# Patient Record
Sex: Male | Born: 1970 | Race: Black or African American | Hispanic: No | Marital: Single | Smoking: Current every day smoker
Health system: Southern US, Community
[De-identification: ages and names within clinical notes are randomized; demographics above are authoritative.]

## PROBLEM LIST (undated history)

## (undated) DIAGNOSIS — I10 Essential (primary) hypertension: Secondary | ICD-10-CM

## (undated) HISTORY — PX: APPENDECTOMY: SHX54

---

## 1998-02-12 ENCOUNTER — Emergency Department (HOSPITAL_COMMUNITY): Admission: EM | Admit: 1998-02-12 | Discharge: 1998-02-12 | Payer: Self-pay | Admitting: Emergency Medicine

## 2011-12-23 ENCOUNTER — Encounter (HOSPITAL_BASED_OUTPATIENT_CLINIC_OR_DEPARTMENT_OTHER): Payer: Self-pay | Admitting: *Deleted

## 2011-12-23 ENCOUNTER — Emergency Department (HOSPITAL_BASED_OUTPATIENT_CLINIC_OR_DEPARTMENT_OTHER)
Admission: EM | Admit: 2011-12-23 | Discharge: 2011-12-23 | Disposition: A | Discharge status: Home / Self Care | Payer: Self-pay | Attending: Emergency Medicine | Admitting: Emergency Medicine

## 2011-12-23 DIAGNOSIS — L02419 Cutaneous abscess of limb, unspecified: Secondary | ICD-10-CM | POA: Insufficient documentation

## 2011-12-23 DIAGNOSIS — L03119 Cellulitis of unspecified part of limb: Secondary | ICD-10-CM | POA: Insufficient documentation

## 2011-12-23 DIAGNOSIS — F17200 Nicotine dependence, unspecified, uncomplicated: Secondary | ICD-10-CM | POA: Insufficient documentation

## 2011-12-23 DIAGNOSIS — L03116 Cellulitis of left lower limb: Secondary | ICD-10-CM

## 2011-12-23 MED ORDER — CEPHALEXIN 500 MG PO CAPS: 500.0000 mg | ORAL_CAPSULE | Freq: Four times a day (QID) | ORAL | Status: DC

## 2011-12-23 MED ORDER — SULFAMETHOXAZOLE-TRIMETHOPRIM 800-160 MG PO TABS: 1.0000 | ORAL_TABLET | Freq: Two times a day (BID) | ORAL | Status: AC

## 2011-12-23 NOTE — ED Notes (Signed)
Abscess on left upper thigh for approximately one week.  Started as a pimple and has now increased in size.

## 2011-12-23 NOTE — ED Provider Notes (Signed)
History     CSN: [id number]  Arrival date & time 12/23/11  1044   First MD Initiated Contact with Patient 12/23/11 1130      Chief Complaint  Patient presents with  . Abscess    (Consider location/radiation/quality/duration/timing/severity/associated sxs/prior treatment) HPI Comments: Patient with a "pimple" on the left lower thigh several days ago.  He squeezed some pus from it, now it is becoming more red and painful.  No fevers or chills.  Patient is a 41 y.o. male presenting with abscess. The history is provided by the patient.  Abscess  This is a new problem. Episode onset: one week ago. The onset was gradual. The problem occurs continuously. The problem has been gradually worsening. Affected Location: left thigh. The abscess is characterized by painfulness and burning.    History reviewed. No pertinent past medical history.  History reviewed. No pertinent past surgical history.  No family history on file.  History  Substance Use Topics  . Smoking status: Current Every Day Smoker    Types: Cigarettes  . Smokeless tobacco: Not on file  . Alcohol Use: Yes     Comment: occassionally      Review of Systems  All other systems reviewed and are negative.    Allergies  Review of patient's allergies indicates no known allergies.  Home Medications  No current outpatient prescriptions on file.  BP 149/87  Pulse 101  Temp 98.2 F (36.8 C) (Oral)  Resp 18  SpO2 98%  Physical Exam  Nursing note and vitals reviewed. Constitutional: He is oriented to person, place, and time. He appears well-developed and well-nourished. No distress.  HENT:  Head: Normocephalic and atraumatic.  Neck: Normal range of motion. Neck supple.  Musculoskeletal: Normal range of motion.       The left thigh has a 3cm round erythematous lesion with a central area of excoriation.  No fluctuance.  Neurological: He is alert and oriented to person, place, and time.  Skin: Skin is warm and  dry. He is not diaphoretic.    ED Course  Procedures (including critical care time)  Labs Reviewed - No data to display No results found.   No diagnosis found.    MDM  I am unable to feel any fluctuance and it does not appear as though there is anything to drain.  He will be treated with antibiotics, warm soaks.  Return prn if worsens.        Geoffery Lyons, MD 12/23/11 1137

## 2013-09-22 ENCOUNTER — Emergency Department (HOSPITAL_BASED_OUTPATIENT_CLINIC_OR_DEPARTMENT_OTHER)
Admission: EM | Admit: 2013-09-22 | Discharge: 2013-09-22 | Disposition: A | Discharge status: Home / Self Care | Payer: Self-pay | Attending: Emergency Medicine | Admitting: Emergency Medicine

## 2013-09-22 ENCOUNTER — Encounter (HOSPITAL_BASED_OUTPATIENT_CLINIC_OR_DEPARTMENT_OTHER): Payer: Self-pay | Admitting: Emergency Medicine

## 2013-09-22 DIAGNOSIS — X58XXXA Exposure to other specified factors, initial encounter: Secondary | ICD-10-CM | POA: Insufficient documentation

## 2013-09-22 DIAGNOSIS — Y939 Activity, unspecified: Secondary | ICD-10-CM | POA: Insufficient documentation

## 2013-09-22 DIAGNOSIS — F17200 Nicotine dependence, unspecified, uncomplicated: Secondary | ICD-10-CM | POA: Insufficient documentation

## 2013-09-22 DIAGNOSIS — K089 Disorder of teeth and supporting structures, unspecified: Secondary | ICD-10-CM | POA: Insufficient documentation

## 2013-09-22 DIAGNOSIS — K0889 Other specified disorders of teeth and supporting structures: Secondary | ICD-10-CM

## 2013-09-22 DIAGNOSIS — S025XXA Fracture of tooth (traumatic), initial encounter for closed fracture: Secondary | ICD-10-CM | POA: Insufficient documentation

## 2013-09-22 DIAGNOSIS — Y929 Unspecified place or not applicable: Secondary | ICD-10-CM | POA: Insufficient documentation

## 2013-09-22 MED ORDER — IBUPROFEN 800 MG PO TABS: 800.0000 mg | ORAL_TABLET | Freq: Three times a day (TID) | ORAL | Status: DC

## 2013-09-22 MED ORDER — OXYCODONE-ACETAMINOPHEN 5-325 MG PO TABS
1.0000 | ORAL_TABLET | Freq: Once | ORAL | Status: AC
  Administered 2013-09-22: 1 via ORAL
  Filled 2013-09-22: qty 1

## 2013-09-22 MED ORDER — OXYCODONE-ACETAMINOPHEN 5-325 MG PO TABS: 1.0000 | ORAL_TABLET | ORAL | Status: DC | PRN

## 2013-09-22 MED ORDER — IBUPROFEN 800 MG PO TABS
800.0000 mg | ORAL_TABLET | Freq: Once | ORAL | Status: AC
  Administered 2013-09-22: 800 mg via ORAL
  Filled 2013-09-22: qty 1

## 2013-09-22 NOTE — ED Notes (Signed)
Patient here with left upper wisdom tooth that broke on Friday, pain from same

## 2013-09-22 NOTE — Discharge Instructions (Signed)

## 2013-09-22 NOTE — ED Provider Notes (Signed)
CSN: [id number]     Arrival date & time 09/22/13  1148 History   First MD Initiated Contact with Patient 09/22/13 1330     Chief Complaint  Patient presents with  . Dental Pain     (Consider location/radiation/quality/duration/timing/severity/associated sxs/prior Treatment) Patient is a 43 y.o. male presenting with tooth pain. The history is provided by the patient.  Dental Pain Location:  Upper Upper teeth location:  16/LU 3rd molar Severity:  Severe Onset quality:  Sudden Duration:  3 days Timing:  Constant Context: dental fracture   Relieved by:  Nothing Associated symptoms: no difficulty swallowing, no facial swelling, no fever and no neck pain     History reviewed. No pertinent past medical history. History reviewed. No pertinent past surgical history. No family history on file. History  Substance Use Topics  . Smoking status: Current Every Day Smoker    Types: Cigarettes  . Smokeless tobacco: Not on file  . Alcohol Use: Yes     Comment: occassionally    Review of Systems  Constitutional: Negative for fever.  HENT: Positive for dental problem. Negative for facial swelling and trouble swallowing.   Gastrointestinal: Negative for nausea.  Musculoskeletal: Negative for neck pain.  Skin: Negative for color change.      Allergies  Review of patient's allergies indicates no known allergies.  Home Medications   Prior to Admission medications   Not on File   BP 136/101  Pulse 86  Temp(Src) 98.3 F (36.8 C) (Oral)  Resp 18  Ht  (1.778 m)  Wt 225 lb (102.059 kg)  BMI 32.28 kg/m2  SpO2 97% Physical Exam  Constitutional: He appears well-developed and well-nourished. No distress.  Uncomfortable in appearance.   HENT:  Head: Normocephalic.  Mouth/Throat: Oropharynx is clear and moist.  Fracture to #16. No visualized abscess. No facial swelling.   Neck: Normal range of motion. Neck supple.  Pulmonary/Chest: Effort normal.  Lymphadenopathy:    He has  no cervical adenopathy.    ED Course  Procedures (including critical care time) Labs Review Labs Reviewed - No data to display  Imaging Review No results found.   EKG Interpretation None      MDM   Final diagnoses:  None    1. Dental fracture  Pain management provided. No abscess identified. Refer to dentistry.    Arnoldo Hooker, PA-C 09/22/13 1354

## 2013-09-24 NOTE — ED Provider Notes (Signed)
Medical screening examination/treatment/procedure(s) were performed by non-physician practitioner and as supervising physician I was immediately available for consultation/collaboration.   EKG Interpretation None        Belinda Schlichting T Teriann Livingood, MD 09/24/13 0709 

## 2014-09-29 ENCOUNTER — Encounter (HOSPITAL_COMMUNITY): Admission: EM | Disposition: A | Discharge status: Home / Self Care | Payer: Self-pay | Source: Home / Self Care

## 2014-09-29 ENCOUNTER — Inpatient Hospital Stay (HOSPITAL_COMMUNITY): Payer: Self-pay | Admitting: Anesthesiology

## 2014-09-29 ENCOUNTER — Emergency Department (HOSPITAL_COMMUNITY): Payer: Self-pay

## 2014-09-29 ENCOUNTER — Inpatient Hospital Stay (HOSPITAL_COMMUNITY)
Admission: EM | Admit: 2014-09-29 | Discharge: 2014-09-30 | DRG: 343 | Disposition: A | Discharge status: Home / Self Care | Payer: Self-pay | Attending: Surgery | Admitting: Surgery

## 2014-09-29 ENCOUNTER — Encounter (HOSPITAL_COMMUNITY): Payer: Self-pay | Admitting: *Deleted

## 2014-09-29 DIAGNOSIS — F17210 Nicotine dependence, cigarettes, uncomplicated: Secondary | ICD-10-CM | POA: Diagnosis present

## 2014-09-29 DIAGNOSIS — R1031 Right lower quadrant pain: Secondary | ICD-10-CM

## 2014-09-29 DIAGNOSIS — K3580 Unspecified acute appendicitis: Principal | ICD-10-CM | POA: Diagnosis present

## 2014-09-29 HISTORY — PX: LAPAROSCOPIC APPENDECTOMY: SHX408

## 2014-09-29 LAB — COMPREHENSIVE METABOLIC PANEL
ALK PHOS: 74 U/L (ref 38–126)
ALT: 26 U/L (ref 17–63)
ANION GAP: 12 (ref 5–15)
AST: 28 U/L (ref 15–41)
Albumin: 4.2 g/dL (ref 3.5–5.0)
BILIRUBIN TOTAL: 1.2 mg/dL (ref 0.3–1.2)
BUN: 9 mg/dL (ref 6–20)
CALCIUM: 9.4 mg/dL (ref 8.9–10.3)
CO2: 23 mmol/L (ref 22–32)
CREATININE: 1.12 mg/dL (ref 0.61–1.24)
Chloride: 100 mmol/L — ABNORMAL LOW (ref 101–111)
Glucose, Bld: 125 mg/dL — ABNORMAL HIGH (ref 65–99)
Potassium: 4 mmol/L (ref 3.5–5.1)
Sodium: 135 mmol/L (ref 135–145)
TOTAL PROTEIN: 7 g/dL (ref 6.5–8.1)

## 2014-09-29 LAB — DIFFERENTIAL
BASOS PCT: 0 % (ref 0–1)
Basophils Absolute: 0 10*3/uL (ref 0.0–0.1)
EOS PCT: 0 % (ref 0–5)
Eosinophils Absolute: 0 10*3/uL (ref 0.0–0.7)
Lymphocytes Relative: 12 % (ref 12–46)
Lymphs Abs: 2 10*3/uL (ref 0.7–4.0)
MONO ABS: 1.1 10*3/uL — AB (ref 0.1–1.0)
Monocytes Relative: 7 % (ref 3–12)
NEUTROS ABS: 13.6 10*3/uL — AB (ref 1.7–7.7)
NEUTROS PCT: 81 % — AB (ref 43–77)

## 2014-09-29 LAB — CBC
HCT: 43.4 % (ref 39.0–52.0)
HEMOGLOBIN: 14.9 g/dL (ref 13.0–17.0)
MCH: 29.6 pg (ref 26.0–34.0)
MCHC: 34.3 g/dL (ref 30.0–36.0)
MCV: 86.3 fL (ref 78.0–100.0)
PLATELETS: 280 10*3/uL (ref 150–400)
RBC: 5.03 MIL/uL (ref 4.22–5.81)
RDW: 13.2 % (ref 11.5–15.5)
WBC: 16.8 10*3/uL — AB (ref 4.0–10.5)

## 2014-09-29 LAB — LIPASE, BLOOD: Lipase: 18 U/L — ABNORMAL LOW (ref 22–51)

## 2014-09-29 LAB — URINALYSIS, ROUTINE W REFLEX MICROSCOPIC
Bilirubin Urine: NEGATIVE
GLUCOSE, UA: NEGATIVE mg/dL
Hgb urine dipstick: NEGATIVE
KETONES UR: NEGATIVE mg/dL
LEUKOCYTES UA: NEGATIVE
NITRITE: NEGATIVE
PROTEIN: NEGATIVE mg/dL
Specific Gravity, Urine: 1.009 (ref 1.005–1.030)
UROBILINOGEN UA: 0.2 mg/dL (ref 0.0–1.0)
pH: 6.5 (ref 5.0–8.0)

## 2014-09-29 SURGERY — APPENDECTOMY, LAPAROSCOPIC
Anesthesia: General

## 2014-09-29 MED ORDER — CIPROFLOXACIN IN D5W 400 MG/200ML IV SOLN
INTRAVENOUS | Status: DC | PRN
  Administered 2014-09-29: 400 mg via INTRAVENOUS

## 2014-09-29 MED ORDER — ONDANSETRON 4 MG PO TBDP
4.0000 mg | ORAL_TABLET | Freq: Four times a day (QID) | ORAL | Status: DC | PRN
Start: 2014-09-29 — End: 2014-09-30

## 2014-09-29 MED ORDER — FENTANYL CITRATE (PF) 100 MCG/2ML IJ SOLN
INTRAMUSCULAR | Status: AC
  Administered 2014-09-29: 50 ug via NASAL
  Filled 2014-09-29: qty 2

## 2014-09-29 MED ORDER — LIDOCAINE HCL (CARDIAC) 20 MG/ML IV SOLN
INTRAVENOUS | Status: DC | PRN
  Administered 2014-09-29: 80 mg via INTRAVENOUS

## 2014-09-29 MED ORDER — PROMETHAZINE HCL 25 MG/ML IJ SOLN: 6.2500 mg | INTRAMUSCULAR | Status: DC | PRN

## 2014-09-29 MED ORDER — BUPIVACAINE-EPINEPHRINE 0.25% -1:200000 IJ SOLN
INTRAMUSCULAR | Status: DC | PRN
  Administered 2014-09-29 (×2): 10 mL

## 2014-09-29 MED ORDER — HYDROMORPHONE HCL 1 MG/ML IJ SOLN
INTRAMUSCULAR | Status: AC
  Filled 2014-09-29: qty 1

## 2014-09-29 MED ORDER — SODIUM CHLORIDE 0.9 % IV SOLN
INTRAVENOUS | Status: DC | PRN
  Administered 2014-09-29: 21:00:00 via INTRAVENOUS

## 2014-09-29 MED ORDER — ARTIFICIAL TEARS OP OINT
TOPICAL_OINTMENT | OPHTHALMIC | Status: DC | PRN
  Administered 2014-09-29: 1 via OPHTHALMIC

## 2014-09-29 MED ORDER — OXYCODONE-ACETAMINOPHEN 5-325 MG PO TABS
1.0000 | ORAL_TABLET | ORAL | Status: DC | PRN
  Administered 2014-09-30: 2 via ORAL
  Administered 2014-09-30: 1 via ORAL
  Filled 2014-09-29: qty 2
  Filled 2014-09-29: qty 1

## 2014-09-29 MED ORDER — CIPROFLOXACIN IN D5W 400 MG/200ML IV SOLN
400.0000 mg | Freq: Once | INTRAVENOUS | Status: AC
  Administered 2014-09-29: 400 mg via INTRAVENOUS
  Filled 2014-09-29: qty 200

## 2014-09-29 MED ORDER — SUCCINYLCHOLINE CHLORIDE 20 MG/ML IJ SOLN
INTRAMUSCULAR | Status: DC | PRN
  Administered 2014-09-29: 140 mg via INTRAVENOUS

## 2014-09-29 MED ORDER — FENTANYL CITRATE (PF) 250 MCG/5ML IJ SOLN
INTRAMUSCULAR | Status: AC
  Filled 2014-09-29: qty 5

## 2014-09-29 MED ORDER — FENTANYL CITRATE (PF) 100 MCG/2ML IJ SOLN
INTRAMUSCULAR | Status: DC | PRN
  Administered 2014-09-29: 50 ug via INTRAVENOUS
  Administered 2014-09-29 (×2): 100 ug via INTRAVENOUS

## 2014-09-29 MED ORDER — MORPHINE SULFATE (PF) 4 MG/ML IV SOLN
4.0000 mg | Freq: Once | INTRAVENOUS | Status: AC
Start: 2014-09-29 — End: 2014-09-29
  Administered 2014-09-29: 4 mg via INTRAVENOUS
  Filled 2014-09-29: qty 1

## 2014-09-29 MED ORDER — METRONIDAZOLE IN NACL 5-0.79 MG/ML-% IV SOLN
500.0000 mg | Freq: Three times a day (TID) | INTRAVENOUS | Status: AC
Start: 2014-09-30 — End: 2014-09-30
  Administered 2014-09-30 (×2): 500 mg via INTRAVENOUS
  Filled 2014-09-29 (×2): qty 100

## 2014-09-29 MED ORDER — MORPHINE SULFATE (PF) 2 MG/ML IV SOLN: 2.0000 mg | INTRAVENOUS | Status: DC | PRN

## 2014-09-29 MED ORDER — IOHEXOL 300 MG/ML  SOLN
25.0000 mL | Freq: Once | INTRAMUSCULAR | Status: AC | PRN
  Administered 2014-09-29: 25 mL via ORAL

## 2014-09-29 MED ORDER — DEXTROSE 5 % IV SOLN
2.0000 g | INTRAVENOUS | Status: AC
  Administered 2014-09-30: 2 g via INTRAVENOUS
  Filled 2014-09-29: qty 2

## 2014-09-29 MED ORDER — KCL IN DEXTROSE-NACL 20-5-0.45 MEQ/L-%-% IV SOLN
INTRAVENOUS | Status: AC
  Filled 2014-09-29: qty 1000

## 2014-09-29 MED ORDER — DEXAMETHASONE SODIUM PHOSPHATE 10 MG/ML IJ SOLN
INTRAMUSCULAR | Status: AC
  Filled 2014-09-29: qty 1

## 2014-09-29 MED ORDER — SODIUM CHLORIDE 0.9 % IR SOLN
Status: DC | PRN
  Administered 2014-09-29: 1

## 2014-09-29 MED ORDER — MIDAZOLAM HCL 5 MG/5ML IJ SOLN
INTRAMUSCULAR | Status: DC | PRN
  Administered 2014-09-29: 2 mg via INTRAVENOUS

## 2014-09-29 MED ORDER — MIDAZOLAM HCL 2 MG/2ML IJ SOLN
INTRAMUSCULAR | Status: AC
  Filled 2014-09-29: qty 4

## 2014-09-29 MED ORDER — ONDANSETRON HCL 4 MG/2ML IJ SOLN: 4.0000 mg | Freq: Four times a day (QID) | INTRAMUSCULAR | Status: DC | PRN

## 2014-09-29 MED ORDER — METRONIDAZOLE IN NACL 5-0.79 MG/ML-% IV SOLN
INTRAVENOUS | Status: DC | PRN
  Administered 2014-09-29: 500 mg via INTRAVENOUS

## 2014-09-29 MED ORDER — HYDROMORPHONE HCL 1 MG/ML IJ SOLN
INTRAMUSCULAR | Status: AC
  Administered 2014-09-29: 0.5 mg via INTRAVENOUS
  Filled 2014-09-29: qty 1

## 2014-09-29 MED ORDER — IOHEXOL 300 MG/ML  SOLN
80.0000 mL | Freq: Once | INTRAMUSCULAR | Status: AC | PRN
  Administered 2014-09-29: 80 mL via INTRAVENOUS

## 2014-09-29 MED ORDER — KCL IN DEXTROSE-NACL 20-5-0.45 MEQ/L-%-% IV SOLN
INTRAVENOUS | Status: DC
  Administered 2014-09-29: 100 mL/h via INTRAVENOUS

## 2014-09-29 MED ORDER — BUPIVACAINE-EPINEPHRINE (PF) 0.25% -1:200000 IJ SOLN
INTRAMUSCULAR | Status: AC
Start: 2014-09-29 — End: 2014-09-29
  Filled 2014-09-29: qty 30

## 2014-09-29 MED ORDER — HYDROMORPHONE HCL 1 MG/ML IJ SOLN
0.2500 mg | INTRAMUSCULAR | Status: DC | PRN
  Administered 2014-09-29 (×3): 0.5 mg via INTRAVENOUS

## 2014-09-29 MED ORDER — SODIUM CHLORIDE 0.9 % IV BOLUS (SEPSIS)
1000.0000 mL | Freq: Once | INTRAVENOUS | Status: AC
  Administered 2014-09-29: 1000 mL via INTRAVENOUS

## 2014-09-29 MED ORDER — MORPHINE SULFATE (PF) 4 MG/ML IV SOLN
4.0000 mg | Freq: Once | INTRAVENOUS | Status: AC
  Administered 2014-09-29: 4 mg via INTRAVENOUS
  Filled 2014-09-29: qty 1

## 2014-09-29 MED ORDER — SUGAMMADEX SODIUM 200 MG/2ML IV SOLN
INTRAVENOUS | Status: DC | PRN
  Administered 2014-09-29: 200 mg via INTRAVENOUS

## 2014-09-29 MED ORDER — ROCURONIUM BROMIDE 100 MG/10ML IV SOLN
INTRAVENOUS | Status: DC | PRN
  Administered 2014-09-29: 20 mg via INTRAVENOUS
  Administered 2014-09-29: 30 mg via INTRAVENOUS

## 2014-09-29 MED ORDER — METRONIDAZOLE IN NACL 5-0.79 MG/ML-% IV SOLN
500.0000 mg | Freq: Once | INTRAVENOUS | Status: AC
  Administered 2014-09-29: 500 mg via INTRAVENOUS
  Filled 2014-09-29: qty 100

## 2014-09-29 MED ORDER — CEFAZOLIN SODIUM-DEXTROSE 2-3 GM-% IV SOLR
INTRAVENOUS | Status: AC
Start: 2014-09-29 — End: 2014-09-29
  Filled 2014-09-29: qty 50

## 2014-09-29 MED ORDER — HEPARIN SODIUM (PORCINE) 5000 UNIT/ML IJ SOLN
5000.0000 [IU] | Freq: Three times a day (TID) | INTRAMUSCULAR | Status: DC
  Administered 2014-09-30: 5000 [IU] via SUBCUTANEOUS
  Filled 2014-09-29: qty 1

## 2014-09-29 MED ORDER — LACTATED RINGERS IV SOLN
INTRAVENOUS | Status: DC | PRN
  Administered 2014-09-29 (×2): via INTRAVENOUS

## 2014-09-29 MED ORDER — ONDANSETRON HCL 4 MG/2ML IJ SOLN
INTRAMUSCULAR | Status: DC | PRN
  Administered 2014-09-29: 4 mg via INTRAVENOUS

## 2014-09-29 MED ORDER — FENTANYL CITRATE (PF) 100 MCG/2ML IJ SOLN
50.0000 ug | Freq: Once | INTRAMUSCULAR | Status: AC
  Administered 2014-09-29: 50 ug via NASAL

## 2014-09-29 MED ORDER — PROPOFOL 10 MG/ML IV BOLUS
INTRAVENOUS | Status: DC | PRN
  Administered 2014-09-29: 280 mg via INTRAVENOUS

## 2014-09-29 SURGICAL SUPPLY — 36 items
APPLIER CLIP ROT 10 11.4 M/L (STAPLE)
CANISTER SUCTION 2500CC (MISCELLANEOUS) ×2 IMPLANT
CHLORAPREP W/TINT 26ML (MISCELLANEOUS) ×2 IMPLANT
CLIP APPLIE ROT 10 11.4 M/L (STAPLE) IMPLANT
COVER SURGICAL LIGHT HANDLE (MISCELLANEOUS) ×2 IMPLANT
CUTTER FLEX LINEAR 45M (STAPLE) ×2 IMPLANT
ELECT REM PT RETURN 9FT ADLT (ELECTROSURGICAL) ×2
ELECTRODE REM PT RTRN 9FT ADLT (ELECTROSURGICAL) ×1 IMPLANT
GLOVE BIO SURGEON STRL SZ7 (GLOVE) ×2 IMPLANT
GLOVE BIOGEL PI IND STRL 7.0 (GLOVE) ×1 IMPLANT
GLOVE BIOGEL PI INDICATOR 7.0 (GLOVE) ×1
GOWN STRL REUS W/ TWL LRG LVL3 (GOWN DISPOSABLE) ×3 IMPLANT
GOWN STRL REUS W/TWL LRG LVL3 (GOWN DISPOSABLE) ×3
KIT BASIN OR (CUSTOM PROCEDURE TRAY) ×2 IMPLANT
KIT ROOM TURNOVER OR (KITS) ×2 IMPLANT
LIQUID BAND (GAUZE/BANDAGES/DRESSINGS) ×2 IMPLANT
NS IRRIG 1000ML POUR BTL (IV SOLUTION) ×2 IMPLANT
PAD ARMBOARD 7.5X6 YLW CONV (MISCELLANEOUS) ×4 IMPLANT
POUCH RETRIEVAL ECOSAC 10 (ENDOMECHANICALS) ×1 IMPLANT
POUCH RETRIEVAL ECOSAC 10MM (ENDOMECHANICALS) ×1
RELOAD 45 VASCULAR/THIN (ENDOMECHANICALS) IMPLANT
RELOAD STAPLE TA45 3.5 REG BLU (ENDOMECHANICALS) ×2 IMPLANT
SCALPEL HARMONIC ACE (MISCELLANEOUS) IMPLANT
SCISSORS LAP 5X35 DISP (ENDOMECHANICALS) ×2 IMPLANT
SET IRRIG TUBING LAPAROSCOPIC (IRRIGATION / IRRIGATOR) ×2 IMPLANT
SLEEVE ENDOPATH XCEL 5M (ENDOMECHANICALS) ×2 IMPLANT
SPECIMEN JAR SMALL (MISCELLANEOUS) ×2 IMPLANT
STRIP CLOSURE SKIN 1/2X4 (GAUZE/BANDAGES/DRESSINGS) IMPLANT
SUT MNCRL AB 4-0 PS2 18 (SUTURE) ×2 IMPLANT
TOWEL OR 17X24 6PK STRL BLUE (TOWEL DISPOSABLE) ×2 IMPLANT
TOWEL OR 17X26 10 PK STRL BLUE (TOWEL DISPOSABLE) ×2 IMPLANT
TRAY FOLEY CATH 16FR SILVER (SET/KITS/TRAYS/PACK) IMPLANT
TRAY LAPAROSCOPIC MC (CUSTOM PROCEDURE TRAY) ×2 IMPLANT
TROCAR XCEL BLUNT TIP 100MML (ENDOMECHANICALS) ×2 IMPLANT
TROCAR XCEL NON-BLD 5MMX100MML (ENDOMECHANICALS) ×2 IMPLANT
TUBING INSUFFLATION (TUBING) ×2 IMPLANT

## 2014-09-29 NOTE — Anesthesia Preprocedure Evaluation (Addendum)
Anesthesia Evaluation  Patient identified by MRN, date of birth, ID band Patient awake    Reviewed: Allergy & Precautions, NPO status , Patient's Chart, lab work & pertinent test results  Airway Mallampati: II  TM Distance: >3 FB Neck ROM: Full    Dental   Pulmonary Current Smoker,  breath sounds clear to auscultation        Cardiovascular negative cardio ROS  Rhythm:Regular Rate:Normal     Neuro/Psych    GI/Hepatic negative GI ROS, Neg liver ROS,   Endo/Other  negative endocrine ROS  Renal/GU negative Renal ROS     Musculoskeletal   Abdominal   Peds  Hematology   Anesthesia Other Findings   Reproductive/Obstetrics                            Anesthesia Physical Anesthesia Plan  ASA: II and emergent  Anesthesia Plan: General   Post-op Pain Management:    Induction: Intravenous, Rapid sequence and Cricoid pressure planned  Airway Management Planned:   Additional Equipment:   Intra-op Plan:   Post-operative Plan: Extubation in OR  Informed Consent: I have reviewed the patients History and Physical, chart, labs and discussed the procedure including the risks, benefits and alternatives for the proposed anesthesia with the patient or authorized representative who has indicated his/her understanding and acceptance.   Dental advisory given  Plan Discussed with: CRNA, Anesthesiologist and Surgeon  Anesthesia Plan Comments:         Anesthesia Quick Evaluation

## 2014-09-29 NOTE — ED Notes (Signed)
Pt's shirt, undershirt, pants, sandals, and socks all placed in belongings bag with pt's sticker on bag.  Cell phone in pants pocket.  Pt sts he sent his wallet home with his girlfriend.

## 2014-09-29 NOTE — ED Notes (Signed)
Pt back from CT.  Able to ambulate to bathroom independently.  Gait steady and even, but pt hunched over due to pain.

## 2014-09-29 NOTE — ED Notes (Signed)
Surgeon at bedside.  EDP at bedside

## 2014-09-29 NOTE — ED Provider Notes (Signed)
CSN: [id number]     Arrival date & time 09/29/14  1338 History   First MD Initiated Contact with Patient 09/29/14 1715     Chief Complaint  Patient presents with  . Abdominal Pain     (Consider location/radiation/quality/duration/timing/severity/associated sxs/prior Treatment) HPI Comments: Jay Sweeney is a 44 y.o. male who presents to the ED with complaints of right lower quadrant abdominal pain that began suddenly yesterday around 5 PM. He distress. 10/10 constant sharp nonradiating pain worse with movement, and unrelieved with Gas-X at home and sentinel here. Associated symptoms include chills and one episode of watery diarrhea today. He states that initially he was constipated, but he passed several small hard stools just prior to the diarrhea episode. States he was nauseated yesterday but no longer nauseated today. He denies any fevers, chest pain, shortness of breath, nausea, vomiting, ongoing constipation, obstipation, rectal pain, melena, hematochezia, testicular pain or swelling, penile discharge, dysuria, hematuria, numbness, tingling, weakness, recent travel, sick contacts, antibiotic use, suspicious food intake, chronic NSAID use, or prior abdominal surgeries. He admits to drinking "a few beers" daily.  Last meal was dinner last night.  Patient is a 44 y.o. male presenting with abdominal pain. The history is provided by the patient. No language interpreter was used.  Abdominal Pain Pain location:  RLQ Pain quality: sharp   Pain radiates to:  Does not radiate Pain severity:  Severe Onset quality:  Sudden Duration:  24 hours Timing:  Constant Progression:  Worsening Chronicity:  New Context: not recent travel, not sick contacts and not suspicious food intake   Relieved by:  Nothing Worsened by:  Movement Ineffective treatments: fentanyl , gas x. Associated symptoms: chills and diarrhea (x1)   Associated symptoms: no chest pain, no constipation, no dysuria, no fever,  no flatus, no hematemesis, no hematochezia, no hematuria, no melena, no nausea (none today), no shortness of breath and no vomiting   Risk factors: alcohol abuse   Risk factors: has not had multiple surgeries and no NSAID use     History reviewed. No pertinent past medical history. History reviewed. No pertinent past surgical history. History reviewed. No pertinent family history. Social History  Substance Use Topics  . Smoking status: Current Every Day Smoker    Types: Cigarettes  . Smokeless tobacco: None  . Alcohol Use: Yes     Comment: occassionally    Review of Systems  Constitutional: Positive for chills. Negative for fever.  Respiratory: Negative for shortness of breath.   Cardiovascular: Negative for chest pain.  Gastrointestinal: Positive for abdominal pain and diarrhea (x1). Negative for nausea (none today), vomiting, constipation, blood in stool, melena, hematochezia, flatus and hematemesis.  Genitourinary: Negative for dysuria, hematuria, flank pain, discharge, scrotal swelling and testicular pain.  Musculoskeletal: Negative for myalgias and arthralgias.  Skin: Negative for color change.  Allergic/Immunologic: Negative for immunocompromised state.  Neurological: Negative for weakness and numbness.  Psychiatric/Behavioral: Negative for confusion.   10 Systems reviewed and are negative for acute change except as noted in the HPI.    Allergies  Review of patient's allergies indicates no known allergies.  Home Medications   Prior to Admission medications   Medication Sig Start Date End Date Taking? Authorizing Provider  ibuprofen (ADVIL,MOTRIN) 800 MG tablet Take 1 tablet (800 mg total) by mouth 3 (three) times daily. 09/22/13  Yes Elpidio Anis, PA-C  oxyCODONE-acetaminophen (PERCOCET/ROXICET) 5-325 MG per tablet Take 1-2 tablets by mouth every 4 (four) hours as needed for severe pain. 09/22/13  Yes Shari Upstill, PA-C   BP 150/100 mmHg  Pulse 88  Temp(Src) 99 F  (37.2 C) (Oral)  Resp 17  SpO2 95% Physical Exam  Constitutional: He is oriented to person, place, and time. Vital signs are normal. He appears well-developed and well-nourished.  Non-toxic appearance. He appears distressed.  Afebrile, nontoxic, appears uncomfortable curled into fetal position  HENT:  Head: Normocephalic and atraumatic.  Mouth/Throat: Oropharynx is clear and moist and mucous membranes are normal.  Eyes: Conjunctivae and EOM are normal. Right eye exhibits no discharge. Left eye exhibits no discharge.  Neck: Normal range of motion. Neck supple.  Cardiovascular: Normal rate, regular rhythm, normal heart sounds and intact distal pulses.  Exam reveals no gallop and no friction rub.   No murmur heard. Pulmonary/Chest: Effort normal and breath sounds normal. No respiratory distress. He has no decreased breath sounds. He has no wheezes. He has no rhonchi. He has no rales.  Abdominal: Soft. Normal appearance and bowel sounds are normal. He exhibits no distension. There is tenderness. There is guarding and tenderness at McBurney's point. There is no rigidity, no rebound, no CVA tenderness and negative Murphy's sign.    Soft, nondistended, +BS throughout, with RLQ TTP at mcburney's point, mild voluntary guarding, no rigidity/rebound, neg psoas sign, neg foot tap test, neg murphy's, no CVA TTP   Musculoskeletal: Normal range of motion.  Neurological: He is alert and oriented to person, place, and time. He has normal strength. No sensory deficit.  Skin: Skin is warm, dry and intact. No rash noted.  Psychiatric: He has a normal mood and affect.  Nursing note and vitals reviewed.   ED Course  Procedures (including critical care time) Labs Review Labs Reviewed  LIPASE, BLOOD - Abnormal; Notable for the following:    Lipase 18 (*)    All other components within normal limits  COMPREHENSIVE METABOLIC PANEL - Abnormal; Notable for the following:    Chloride 100 (*)    Glucose, Bld  125 (*)    All other components within normal limits  CBC - Abnormal; Notable for the following:    WBC 16.8 (*)    All other components within normal limits  DIFFERENTIAL - Abnormal; Notable for the following:    Neutrophils Relative % 81 (*)    Neutro Abs 13.6 (*)    Monocytes Absolute 1.1 (*)    All other components within normal limits  URINALYSIS, ROUTINE W REFLEX MICROSCOPIC (NOT AT [facility])    Imaging Review Ct Abdomen Pelvis W Contrast  09/29/2014   CLINICAL DATA:  Right lower quadrant pain starting 1 day ago.  EXAM: CT ABDOMEN AND PELVIS WITH CONTRAST  TECHNIQUE: Multidetector CT imaging of the abdomen and pelvis was performed using the standard protocol following bolus administration of intravenous contrast.  CONTRAST:  80mL OMNIPAQUE IOHEXOL 300 MG/ML  SOLN  COMPARISON:  None.  FINDINGS: Lower chest:  Unremarkable.  Hepatobiliary: No masses or other significant abnormality identified.  Pancreas: No evidence of mass, inflammatory changes, or other significant abnormality.  Spleen:  Within normal limits in size and appearance.  Adrenal Glands:  No masses identified.  Kidneys/Urinary Tract: No evidence of urolithiasis or hydronephrosis. No solid or complex cystic renal masses identified. No masses or calculi seen involving the lower urinary tract.  Stomach/Bowel/Peritoneum: No evidence of wall thickening, mass, or obstruction in the upper gastrointestinal tract.  Vascular/Lymphatic: No pathologically enlarged lymph nodes identified. No other significant abnormality noted.  Reproductive: No masses or other significant abnormality identified.  Other: The appendix is thickened to 1.1 cm and contains an appendicolith proximally. Surrounding inflammatory fat stranding is seen. There is no evidence of rupture.  Musculoskeletal:  No suspicious bone lesions identified.  IMPRESSION: Acute appendicitis.  Surgical consult is recommended.  Otherwise normal appearance of the abdomen and pelvis.  These results  will be called to the ordering clinician or representative by the Radiologist Assistant, and communication documented in the PACS or zVision Dashboard.   Electronically Signed   By: Ted Mcalpine M.D.   On: 09/29/2014 19:17   I have personally reviewed and evaluated these images and lab results as part of my medical decision-making.   EKG Interpretation None      MDM   Final diagnoses:  Acute appendicitis, unspecified acute appendicitis type  RLQ abdominal pain    44 y.o. male here with sudden onset RLQ abd pain x1 day, some diarrhea once today. No tenesmus or rectal pain, no testicular or urinary symptoms. On exam, RLQ TTP at mcburney's point with some guarding, no rebound or rigidity. CBC with leukocytosis of 16.8, will obtain differential. CMP WNL aside from mildly elevated gluc. Lipase WNL. Will get U/A and CT abd/pelvis to eval for appendicitis vs diverticulitis vs other etiology.   7:25 PM Differential with neutrophilic predom. U/A pending. CT abd/pelvis critcal value called in just now, pt with acute appendicitis. Will consult surgery. Will start cipro/flagyl  7:30 PM U/A clear. Dr. Sheliah Hatch returning page, will come see pt for acute appy. Please see his notes for further documentation of care.  BP 159/90 mmHg  Pulse 81  Temp(Src) 99 F (37.2 C) (Oral)  Resp 16  SpO2 97%  Meds ordered this encounter  Medications  . fentaNYL (SUBLIMAZE) injection 50 mcg    Sig:   . fentaNYL (SUBLIMAZE) 100 MCG/2ML injection    Sig:     Browning, Melissa   : cabinet override  . sodium chloride 0.9 % bolus 1,000 mL    Sig:   . morphine 4 MG/ML injection 4 mg    Sig:   . iohexol (OMNIPAQUE) 300 MG/ML solution 25 mL    Sig:   . iohexol (OMNIPAQUE) 300 MG/ML solution 80 mL    Sig:   . morphine 4 MG/ML injection 4 mg    Sig:   . ciprofloxacin (CIPRO) IVPB 400 mg    Sig:     Order Specific Question:  Antibiotic Indication:    Answer:  Intra-abdominal Infection  .  metroNIDAZOLE (FLAGYL) IVPB 500 mg    Sig:     Order Specific Question:  Antibiotic Indication:    Answer:  Intra-abdominal Infection     Allen Derry, PA-C 09/29/14 1931  Laurence Spates, MD 09/30/14 [phone]

## 2014-09-29 NOTE — Transfer of Care (Signed)
Immediate Anesthesia Transfer of Care Note  Patient: Jay Sweeney  Procedure(s) Performed: Procedure(s): APPENDECTOMY LAPAROSCOPIC (N/A)  Patient Location: PACU  Anesthesia Type:General  Level of Consciousness: awake, oriented, sedated, patient cooperative and responds to stimulation  Airway & Oxygen Therapy: Patient Spontanous Breathing and Patient connected to face mask oxygen  Post-op Assessment: Report given to RN, Post -op Vital signs reviewed and stable, Patient moving all extremities and Patient moving all extremities X 4  Post vital signs: Reviewed and stable  Last Vitals:  Filed Vitals:   09/29/14 2000  BP: 146/92  Pulse: 84  Temp:   Resp:     Complications: No apparent anesthesia complications

## 2014-09-29 NOTE — ED Notes (Signed)
This RN has patient sign consent.  Care handoff given to OR RN.

## 2014-09-29 NOTE — ED Notes (Signed)
[facility] radiology informed this RN that the pt is diagnosed with acute appendicitis.  PA made aware.

## 2014-09-29 NOTE — ED Notes (Signed)
Left nare administration of of Fentanyl

## 2014-09-29 NOTE — Op Note (Signed)
Preoperative diagnosis: acute suppurative appendicitis  Postoperative diagnosis: Same   Procedure: laparoscopic appendectomy  Surgeon: Feliciana Rossetti, M.D.  Anesthesia: Gen.   Indications for procedure: Jay Sweeney is a 44 y.o. male with symptoms of diarrhea, pain in right lower quadrant and nausea consistent with acute appendicitis. Confirmed by CT scan and laboratory values.  Description of procedure: The patient was brought into the operative suite, placed supine. Anesthesia was administered with endotracheal tube. The patient's left arm was tucked. All pressure points were offloaded by foam padding. The patient was prepped and draped in the usual sterile fashion.  Vertial incision was made through the umbilicus and a blunt 10mm trocar was placed into the fascial defect. Pneumoperitoneum was applied with high flow low pressure.  2 5mm trocars were placed, one in the suprapubic space, one in the LLQ. All trocars sites were first anesthesized with 0.25% marcaine with epinephrine. Next the patient was placed in trendelenberg, rotated to the left. The omentumwas retracted cephalad. The cecum and appendix were identified. The appendix was inflamed and friable throughout. The base of the appendix was dissected and a window through the mesoappendix was created with blunt dissection. A 45mm blue load stapler was used to go across the base of the appendix which was healthy tissue. Next an endoloop was used to ligate the mesoappendix and then the appendix and mesoappendix were separated with cautery using a hook. Hemostasis was ensured.  The appendix was placed in a specimen bag. The pelvis and RLQ were irrigated. No abscess or purulence were seen in the pelvis. The appendix was removed via the umbilicus. 0 vicryl was used to close the fascial defect. Pneumoperitoneum was removed, all trocars were removed. All incisions were closed with 4-0 monocryl subcuticular stitch. The patient woke from anesthesia  and was brought to PACU in stable condition.  Findings: suppurative appendicitis  Specimen: appendix  Blood loss: <50cc  Local anesthesia: 10cc 0.25% Marcaine  Complications: none  Feliciana Rossetti, M.D. General, Bariatric, & Minimally Invasive Surgery [facility]

## 2014-09-29 NOTE — Anesthesia Postprocedure Evaluation (Signed)
  Anesthesia Post-op Note  Patient: Jay Sweeney  Procedure(s) Performed: Procedure(s): APPENDECTOMY LAPAROSCOPIC (N/A)  Patient Location: PACU  Anesthesia Type:General  Level of Consciousness: awake  Airway and Oxygen Therapy: Patient Spontanous Breathing  Post-op Pain: mild  Post-op Assessment: Post-op Vital signs reviewed              Post-op Vital Signs: Reviewed  Last Vitals:  Filed Vitals:   09/29/14 2230  BP:   Pulse:   Temp: 37.6 C  Resp:     Complications: No apparent anesthesia complications

## 2014-09-29 NOTE — Anesthesia Procedure Notes (Signed)
Procedure Name: Intubation Date/Time: 09/29/2014 9:11 PM Performed by: Wray Kearns A Pre-anesthesia Checklist: Patient identified, Timeout performed, Emergency Drugs available, Suction available and Patient being monitored Patient Re-evaluated:Patient Re-evaluated prior to inductionOxygen Delivery Method: Circle system utilized Preoxygenation: Pre-oxygenation with 100% oxygen Intubation Type: IV induction, Rapid sequence and Cricoid Pressure applied Laryngoscope Size: Mac and 4 Grade View: Grade I Tube type: Oral Tube size: 8.0 mm Number of attempts: 1 Airway Equipment and Method: Stylet Placement Confirmation: ETT inserted through vocal cords under direct vision,  positive ETCO2 and breath sounds checked- equal and bilateral Secured at: 24 cm Tube secured with: Tape Dental Injury: Teeth and Oropharynx as per pre-operative assessment

## 2014-09-29 NOTE — ED Notes (Signed)
Patient reports lower abdominal pain since yesterday with no associated symptoms. Patient states pain is constant and right to mid lower quadrant.

## 2014-09-29 NOTE — Anesthesia Postprocedure Evaluation (Signed)
  Anesthesia Post-op Note  Patient: Jay Sweeney  Procedure(s) Performed: Procedure(s): APPENDECTOMY LAPAROSCOPIC (N/A)  Patient Location: PACU  Anesthesia Type:General  Level of Consciousness: awake  Airway and Oxygen Therapy: Patient Spontanous Breathing  Post-op Pain: mild  Post-op Assessment: Post-op Vital signs reviewed              Post-op Vital Signs: Reviewed  Last Vitals:  Filed Vitals:   09/29/14 2000  BP: 146/92  Pulse: 84  Temp:   Resp:     Complications: No apparent anesthesia complications

## 2014-09-29 NOTE — ED Notes (Signed)
CT notified pt done with contrast. 

## 2014-09-29 NOTE — H&P (Signed)
Jay Sweeney is an 44 y.o. male.   Chief Complaint: abdominal pain HPI: 44 yo male with 1.5 day history of abdominal pain, began diffuse, now RLQ. Associated with inability to defecate. +Nausea, +Anorexia. No fever, no vomiting. No previous episodes  History reviewed. No pertinent past medical history.  History reviewed. No pertinent past surgical history.  History reviewed. No pertinent family history. Social History:  reports that he has been smoking Cigarettes.  He does not have any smokeless tobacco history on file. He reports that he drinks alcohol. He reports that he does not use illicit drugs.  Allergies: No Known Allergies   (Not in a hospital admission)  Results for orders placed or performed during the hospital encounter of 09/29/14 (from the past 48 hour(s))  Lipase, blood     Status: Abnormal   Collection Time: 09/29/14  2:39 PM  Result Value Ref Range   Lipase 18 (L) 22 - 51 U/L  Comprehensive metabolic panel     Status: Abnormal   Collection Time: 09/29/14  2:39 PM  Result Value Ref Range   Sodium 135 135 - 145 mmol/L   Potassium 4.0 3.5 - 5.1 mmol/L   Chloride 100 (L) 101 - 111 mmol/L   CO2 23 22 - 32 mmol/L   Glucose, Bld 125 (H) 65 - 99 mg/dL   BUN 9 6 - 20 mg/dL   Creatinine, Ser 1.12 0.61 - 1.24 mg/dL   Calcium 9.4 8.9 - 10.3 mg/dL   Total Protein 7.0 6.5 - 8.1 g/dL   Albumin 4.2 3.5 - 5.0 g/dL   AST 28 15 - 41 U/L   ALT 26 17 - 63 U/L   Alkaline Phosphatase 74 38 - 126 U/L   Total Bilirubin 1.2 0.3 - 1.2 mg/dL   GFR calc non Af Amer >60 >60 mL/min   GFR calc Af Amer >60 >60 mL/min    Comment: (NOTE) The eGFR has been calculated using the CKD EPI equation. This calculation has not been validated in all clinical situations. eGFR's persistently <60 mL/min signify possible Chronic Kidney Disease.    Anion gap 12 5 - 15  CBC     Status: Abnormal   Collection Time: 09/29/14  2:39 PM  Result Value Ref Range   WBC 16.8 (H) 4.0 - 10.5 K/uL   RBC 5.03  4.22 - 5.81 MIL/uL   Hemoglobin 14.9 13.0 - 17.0 g/dL   HCT 43.4 39.0 - 52.0 %   MCV 86.3 78.0 - 100.0 fL   MCH 29.6 26.0 - 34.0 pg   MCHC 34.3 30.0 - 36.0 g/dL   RDW 13.2 11.5 - 15.5 %   Platelets 280 150 - 400 K/uL  Differential     Status: Abnormal   Collection Time: 09/29/14  2:39 PM  Result Value Ref Range   Neutrophils Relative % 81 (H) 43 - 77 %   Neutro Abs 13.6 (H) 1.7 - 7.7 K/uL   Lymphocytes Relative 12 12 - 46 %   Lymphs Abs 2.0 0.7 - 4.0 K/uL   Monocytes Relative 7 3 - 12 %   Monocytes Absolute 1.1 (H) 0.1 - 1.0 K/uL   Eosinophils Relative 0 0 - 5 %   Eosinophils Absolute 0.0 0.0 - 0.7 K/uL   Basophils Relative 0 0 - 1 %   Basophils Absolute 0.0 0.0 - 0.1 K/uL  Urinalysis, Routine w reflex microscopic (not at [facility])     Status: None   Collection Time: 09/29/14  6:30 PM  Result Value Ref Range   Color, Urine YELLOW YELLOW   APPearance CLEAR CLEAR   Specific Gravity, Urine 1.009 1.005 - 1.030   pH 6.5 5.0 - 8.0   Glucose, UA NEGATIVE NEGATIVE mg/dL   Hgb urine dipstick NEGATIVE NEGATIVE   Bilirubin Urine NEGATIVE NEGATIVE   Ketones, ur NEGATIVE NEGATIVE mg/dL   Protein, ur NEGATIVE NEGATIVE mg/dL   Urobilinogen, UA 0.2 0.0 - 1.0 mg/dL   Nitrite NEGATIVE NEGATIVE   Leukocytes, UA NEGATIVE NEGATIVE    Comment: MICROSCOPIC NOT DONE ON URINES WITH NEGATIVE PROTEIN, BLOOD, LEUKOCYTES, NITRITE, OR GLUCOSE <1000 mg/dL.   Ct Abdomen Pelvis W Contrast  09/29/2014   CLINICAL DATA:  Right lower quadrant pain starting 1 day ago.  EXAM: CT ABDOMEN AND PELVIS WITH CONTRAST  TECHNIQUE: Multidetector CT imaging of the abdomen and pelvis was performed using the standard protocol following bolus administration of intravenous contrast.  CONTRAST:  43m OMNIPAQUE IOHEXOL 300 MG/ML  SOLN  COMPARISON:  None.  FINDINGS: Lower chest:  Unremarkable.  Hepatobiliary: No masses or other significant abnormality identified.  Pancreas: No evidence of mass, inflammatory changes, or other significant  abnormality.  Spleen:  Within normal limits in size and appearance.  Adrenal Glands:  No masses identified.  Kidneys/Urinary Tract: No evidence of urolithiasis or hydronephrosis. No solid or complex cystic renal masses identified. No masses or calculi seen involving the lower urinary tract.  Stomach/Bowel/Peritoneum: No evidence of wall thickening, mass, or obstruction in the upper gastrointestinal tract.  Vascular/Lymphatic: No pathologically enlarged lymph nodes identified. No other significant abnormality noted.  Reproductive: No masses or other significant abnormality identified.  Other: The appendix is thickened to 1.1 cm and contains an appendicolith proximally. Surrounding inflammatory fat stranding is seen. There is no evidence of rupture.  Musculoskeletal:  No suspicious bone lesions identified.  IMPRESSION: Acute appendicitis.  Surgical consult is recommended.  Otherwise normal appearance of the abdomen and pelvis.  These results will be called to the ordering clinician or representative by the Radiologist Assistant, and communication documented in the PACS or zVision Dashboard.   Electronically Signed   By: DFidela SalisburyM.D.   On: 09/29/2014 19:17    Review of Systems  Constitutional: Negative for fever, chills and weight loss.  HENT: Negative for hearing loss.   Eyes: Negative for blurred vision and double vision.  Respiratory: Negative for cough and hemoptysis.   Cardiovascular: Negative for chest pain and palpitations.  Gastrointestinal: Positive for nausea and abdominal pain.  Genitourinary: Negative for hematuria and flank pain.  Musculoskeletal: Negative for back pain and neck pain.  Skin: Negative for itching and rash.  Neurological: Negative for dizziness, tingling and headaches.  Endo/Heme/Allergies: Negative for environmental allergies. Does not bruise/bleed easily.    Blood pressure 159/90, pulse 81, temperature 99 F (37.2 C), temperature source Oral, resp. rate 16,  SpO2 97 %. Physical Exam  Constitutional: He is oriented to person, place, and time. He appears well-developed and well-nourished.  HENT:  Head: Normocephalic and atraumatic.  Eyes: Conjunctivae are normal. Pupils are equal, round, and reactive to light.  Neck: Normal range of motion. Neck supple.  Cardiovascular: Normal rate and regular rhythm.   Respiratory: Breath sounds normal.  GI: He exhibits no distension and no mass. There is tenderness. There is guarding. There is no rebound.  Musculoskeletal: He exhibits no edema or tenderness.  Neurological: He is alert and oriented to person, place, and time.  Skin: Skin is warm and dry.  Psychiatric: He has  a normal mood and affect. His behavior is normal.     Assessment/Plan 44 yo male with 1 day history of abdominal pain consistent with acute appendicitis confirmed by leukocytosis and Ct findings. Options for treatment including antibiotic therapy vs surgery were discussed. The risks of bowel injury, bleeding, hernia, perforation and abscess as well as general anesthetic risks were discussed. The patient and care team decided to proceed with laparoscopic vs open appendectomy. Pt NPO for > 8h. -IV abx -plan for lap v open appendicitis. -admit afterwards for postoperative care -IV pain control -continue fluid resuscitation -NPO  Arta Bruce Giulian Goldring 09/29/2014, 7:49 PM

## 2014-09-29 NOTE — ED Notes (Signed)
Pt placed in gown and all clothing removed.

## 2014-09-30 ENCOUNTER — Encounter (HOSPITAL_COMMUNITY): Payer: Self-pay | Admitting: General Surgery

## 2014-09-30 LAB — CBC
HEMATOCRIT: 44 % (ref 39.0–52.0)
HEMOGLOBIN: 14.8 g/dL (ref 13.0–17.0)
MCH: 29.8 pg (ref 26.0–34.0)
MCHC: 33.6 g/dL (ref 30.0–36.0)
MCV: 88.5 fL (ref 78.0–100.0)
Platelets: 270 10*3/uL (ref 150–400)
RBC: 4.97 MIL/uL (ref 4.22–5.81)
RDW: 13.3 % (ref 11.5–15.5)
WBC: 11.7 10*3/uL — ABNORMAL HIGH (ref 4.0–10.5)

## 2014-09-30 MED ORDER — OXYCODONE-ACETAMINOPHEN 5-325 MG PO TABS: 1.0000 | ORAL_TABLET | Freq: Four times a day (QID) | ORAL | Status: DC | PRN

## 2014-09-30 NOTE — Progress Notes (Signed)
Discharge paperwork given to patient. No questions verbalized. Patient is ready for discharge. 

## 2014-09-30 NOTE — Discharge Summary (Signed)
[facility] Discharge Summary   Patient ID: Jay Sweeney MRN: [id number] DOB/AGE: 10-12-1970 44 y.o.  Admit date: 09/29/2014 Discharge date: 09/30/2014  Admitting Diagnosis: Acute appendicitis  Discharge Diagnosis Patient Active Problem List   Diagnosis Date Noted  . Acute appendicitis 09/29/2014    Consultants None  Imaging: Ct Abdomen Pelvis W Contrast  09/29/2014   CLINICAL DATA:  Right lower quadrant pain starting 1 day ago.  EXAM: CT ABDOMEN AND PELVIS WITH CONTRAST  TECHNIQUE: Multidetector CT imaging of the abdomen and pelvis was performed using the standard protocol following bolus administration of intravenous contrast.  CONTRAST:  80mL OMNIPAQUE IOHEXOL 300 MG/ML  SOLN  COMPARISON:  None.  FINDINGS: Lower chest:  Unremarkable.  Hepatobiliary: No masses or other significant abnormality identified.  Pancreas: No evidence of mass, inflammatory changes, or other significant abnormality.  Spleen:  Within normal limits in size and appearance.  Adrenal Glands:  No masses identified.  Kidneys/Urinary Tract: No evidence of urolithiasis or hydronephrosis. No solid or complex cystic renal masses identified. No masses or calculi seen involving the lower urinary tract.  Stomach/Bowel/Peritoneum: No evidence of wall thickening, mass, or obstruction in the upper gastrointestinal tract.  Vascular/Lymphatic: No pathologically enlarged lymph nodes identified. No other significant abnormality noted.  Reproductive: No masses or other significant abnormality identified.  Other: The appendix is thickened to 1.1 cm and contains an appendicolith proximally. Surrounding inflammatory fat stranding is seen. There is no evidence of rupture.  Musculoskeletal:  No suspicious bone lesions identified.  IMPRESSION: Acute appendicitis.  Surgical consult is recommended.  Otherwise normal appearance of the abdomen and pelvis.  These results will be called to the ordering clinician or representative by  the Radiologist Assistant, and communication documented in the PACS or zVision Dashboard.   Electronically Signed   By: Ted Mcalpine M.D.   On: 09/29/2014 19:17    Procedures Dr. Sheliah Hatch (09/29/14) - Laparoscopic Appendectomy  Hospital Course:  44 yo male with 1.5 day history of abdominal pain, began diffuse, now RLQ. Associated with inability to defecate. +Nausea, +Anorexia. No fever, no vomiting. No previous episodes  Patient was admitted and underwent procedure listed above.  Tolerated procedure well and was transferred to the floor.  Diet was advanced as tolerated.  On POD #1, the patient was voiding well, tolerating diet, ambulating well, pain well controlled, vital signs stable, incisions c/d/i and felt stable for discharge home.  Patient will follow up in our office in 3 weeks and knows to call with questions or concerns.    Physical Exam: General:  Alert, NAD, pleasant, comfortable Abd:  Soft, ND, mild tenderness, incisions C/D/I    Medication List    TAKE these medications        ibuprofen 800 MG tablet  Commonly known as:  ADVIL,MOTRIN  Take 1 tablet (800 mg total) by mouth 3 (three) times daily.     oxyCODONE-acetaminophen 5-325 MG per tablet  Commonly known as:  PERCOCET/ROXICET  Take 1-2 tablets by mouth every 6 (six) hours as needed for moderate pain.         Follow-up Information    Follow up with [facility] [place]. Go on 10/20/2014.   Why:  For post-operation check. Your appointment is at 2:15pm, please arrive at least 30 min before your appointment to complete your check in paperwork.  If you are unable to arrive 30 min prior to your appointment time we may have to cancel or reschedule you   Contact information:  [address] [place] [address] [phone]      Signed: Nonie Hoyer, [facility] [phone]  09/30/2014, 9:03 AM

## 2014-09-30 NOTE — Progress Notes (Addendum)
Received patient from PACU.  Patient AOx4, VS stable with elevated BP but lower than BPs in PACU and pain at 3/10. Per Sookie, RN of PACU, MD aware of elevated BPs and patient not on hypertensive medications.  Oriented to room, bed controls and call light.  Family members with patient at bedside and brought personal belonging of patient to home when family members left.

## 2014-09-30 NOTE — Discharge Instructions (Signed)

## 2014-10-01 ENCOUNTER — Inpatient Hospital Stay (HOSPITAL_COMMUNITY)
Admission: EM | Admit: 2014-10-01 | Discharge: 2014-10-04 | DRG: 395 | Disposition: A | Discharge status: Home / Self Care | Payer: Self-pay | Attending: Surgery | Admitting: Surgery

## 2014-10-01 ENCOUNTER — Encounter (HOSPITAL_COMMUNITY): Payer: Self-pay | Admitting: *Deleted

## 2014-10-01 ENCOUNTER — Emergency Department (HOSPITAL_COMMUNITY): Payer: Self-pay

## 2014-10-01 DIAGNOSIS — Y838 Other surgical procedures as the cause of abnormal reaction of the patient, or of later complication, without mention of misadventure at the time of the procedure: Secondary | ICD-10-CM | POA: Diagnosis present

## 2014-10-01 DIAGNOSIS — K9189 Other postprocedural complications and disorders of digestive system: Secondary | ICD-10-CM | POA: Diagnosis present

## 2014-10-01 DIAGNOSIS — K567 Ileus, unspecified: Secondary | ICD-10-CM | POA: Diagnosis present

## 2014-10-01 DIAGNOSIS — K913 Postprocedural intestinal obstruction: Principal | ICD-10-CM | POA: Diagnosis present

## 2014-10-01 DIAGNOSIS — F17210 Nicotine dependence, cigarettes, uncomplicated: Secondary | ICD-10-CM | POA: Diagnosis present

## 2014-10-01 DIAGNOSIS — R630 Anorexia: Secondary | ICD-10-CM | POA: Diagnosis present

## 2014-10-01 LAB — COMPREHENSIVE METABOLIC PANEL
ALBUMIN: 3.5 g/dL (ref 3.5–5.0)
ALT: 24 U/L (ref 17–63)
ANION GAP: 11 (ref 5–15)
AST: 27 U/L (ref 15–41)
Alkaline Phosphatase: 61 U/L (ref 38–126)
BILIRUBIN TOTAL: 0.7 mg/dL (ref 0.3–1.2)
BUN: 14 mg/dL (ref 6–20)
CHLORIDE: 102 mmol/L (ref 101–111)
CO2: 25 mmol/L (ref 22–32)
Calcium: 9.5 mg/dL (ref 8.9–10.3)
Creatinine, Ser: 1.18 mg/dL (ref 0.61–1.24)
GFR calc Af Amer: >60 mL/min (ref >60)
Glucose, Bld: 111 mg/dL — ABNORMAL HIGH (ref 65–99)
POTASSIUM: 3.9 mmol/L (ref 3.5–5.1)
Sodium: 138 mmol/L (ref 135–145)
TOTAL PROTEIN: 7 g/dL (ref 6.5–8.1)

## 2014-10-01 LAB — CBC
HEMATOCRIT: 43.5 % (ref 39.0–52.0)
HEMOGLOBIN: 15.1 g/dL (ref 13.0–17.0)
MCH: 30.2 pg (ref 26.0–34.0)
MCHC: 34.7 g/dL (ref 30.0–36.0)
MCV: 87 fL (ref 78.0–100.0)
Platelets: 266 10*3/uL (ref 150–400)
RBC: 5 MIL/uL (ref 4.22–5.81)
RDW: 13.3 % (ref 11.5–15.5)
WBC: 13.7 10*3/uL — AB (ref 4.0–10.5)

## 2014-10-01 LAB — LIPASE, BLOOD: LIPASE: 14 U/L — AB (ref 22–51)

## 2014-10-01 MED ORDER — POTASSIUM CHLORIDE IN NACL 20-0.9 MEQ/L-% IV SOLN
INTRAVENOUS | Status: DC
  Administered 2014-10-01 – 2014-10-04 (×7): via INTRAVENOUS
  Filled 2014-10-01 (×7): qty 1000

## 2014-10-01 MED ORDER — ENOXAPARIN SODIUM 40 MG/0.4ML ~~LOC~~ SOLN
40.0000 mg | SUBCUTANEOUS | Status: DC
  Administered 2014-10-01 – 2014-10-03 (×3): 40 mg via SUBCUTANEOUS
  Filled 2014-10-01 (×3): qty 0.4

## 2014-10-01 MED ORDER — PHENOL 1.4 % MT LIQD
1.0000 | OROMUCOSAL | Status: DC | PRN
  Administered 2014-10-01: 1 via OROMUCOSAL
  Filled 2014-10-01: qty 177

## 2014-10-01 MED ORDER — ONDANSETRON HCL 4 MG/2ML IJ SOLN
4.0000 mg | Freq: Four times a day (QID) | INTRAMUSCULAR | Status: DC | PRN
  Administered 2014-10-01: 4 mg via INTRAVENOUS
  Filled 2014-10-01: qty 2

## 2014-10-01 MED ORDER — LORAZEPAM 2 MG/ML IJ SOLN
1.0000 mg | Freq: Once | INTRAMUSCULAR | Status: AC
  Administered 2014-10-01: 1 mg via INTRAVENOUS
  Filled 2014-10-01: qty 1

## 2014-10-01 MED ORDER — SIMETHICONE 80 MG PO CHEW
40.0000 mg | CHEWABLE_TABLET | Freq: Four times a day (QID) | ORAL | Status: DC | PRN
  Administered 2014-10-03 (×2): 40 mg via ORAL
  Filled 2014-10-01 (×2): qty 1

## 2014-10-01 MED ORDER — PANTOPRAZOLE SODIUM 40 MG IV SOLR
40.0000 mg | Freq: Every day | INTRAVENOUS | Status: DC
  Administered 2014-10-01 – 2014-10-03 (×3): 40 mg via INTRAVENOUS
  Filled 2014-10-01 (×3): qty 40

## 2014-10-01 MED ORDER — METRONIDAZOLE IN NACL 5-0.79 MG/ML-% IV SOLN
500.0000 mg | Freq: Three times a day (TID) | INTRAVENOUS | Status: DC
  Administered 2014-10-01 – 2014-10-04 (×8): 500 mg via INTRAVENOUS
  Filled 2014-10-01 (×10): qty 100

## 2014-10-01 MED ORDER — DEXTROSE 5 % IV SOLN
2.0000 g | INTRAVENOUS | Status: DC
  Administered 2014-10-01 – 2014-10-03 (×3): 2 g via INTRAVENOUS
  Filled 2014-10-01 (×4): qty 2

## 2014-10-01 MED ORDER — ONDANSETRON HCL 4 MG/2ML IJ SOLN
4.0000 mg | Freq: Once | INTRAMUSCULAR | Status: AC
  Administered 2014-10-01: 4 mg via INTRAVENOUS
  Filled 2014-10-01: qty 2

## 2014-10-01 MED ORDER — MORPHINE SULFATE (PF) 2 MG/ML IV SOLN
2.0000 mg | INTRAVENOUS | Status: DC | PRN
  Administered 2014-10-02 – 2014-10-03 (×3): 2 mg via INTRAVENOUS
  Filled 2014-10-01: qty 2
  Filled 2014-10-01 (×2): qty 1

## 2014-10-01 MED ORDER — DIPHENHYDRAMINE HCL 25 MG PO CAPS: 25.0000 mg | ORAL_CAPSULE | Freq: Four times a day (QID) | ORAL | Status: DC | PRN

## 2014-10-01 MED ORDER — DIPHENHYDRAMINE HCL 50 MG/ML IJ SOLN: 25.0000 mg | Freq: Four times a day (QID) | INTRAMUSCULAR | Status: DC | PRN

## 2014-10-01 MED ORDER — SODIUM CHLORIDE 0.9 % IV BOLUS (SEPSIS)
1000.0000 mL | Freq: Once | INTRAVENOUS | Status: AC
  Administered 2014-10-01: 1000 mL via INTRAVENOUS

## 2014-10-01 MED ORDER — ONDANSETRON 4 MG PO TBDP: 4.0000 mg | ORAL_TABLET | Freq: Four times a day (QID) | ORAL | Status: DC | PRN

## 2014-10-01 NOTE — Progress Notes (Signed)
Pt admitted to 6N21 A&O to room and unit in stable condition.  NG tube in placed and placed on LIWS-fluid being sucked into canister.  IV hooked up and site CDI.  Will continue to monitor pt closely and carry out orders. [name]

## 2014-10-01 NOTE — ED Provider Notes (Signed)
CSN: [id number]     Arrival date & time 10/01/14  1638 History   First MD Initiated Contact with Patient 10/01/14 1712     Chief Complaint  Patient presents with  . Post-op Problem  . Emesis     (Consider location/radiation/quality/duration/timing/severity/associated sxs/prior Treatment) The history is provided by the patient.  Jay Sweeney is a 44 y.o. male here with abdominal distention, vomiting. Patient had appendectomy yesterday and was discharged yesterday at 4pm. Tolerated food in the hospital. [facility] home and started having vomiting and abdominal distention. Given phenergan but still vomiting and having pain. Hasn't passed gas or have bowel movement since surgery.    History reviewed. No pertinent past medical history. Past Surgical History  Procedure Laterality Date  . Laparoscopic appendectomy N/A 09/29/2014    Procedure: APPENDECTOMY LAPAROSCOPIC;  Surgeon: Rodman Pickle, MD;  Location: [facility] OR;  Service: General;  Laterality: N/A;   No family history on file. Social History  Substance Use Topics  . Smoking status: Current Every Day Smoker    Types: Cigarettes  . Smokeless tobacco: None  . Alcohol Use: Yes     Comment: occassionally    Review of Systems  Gastrointestinal: Positive for vomiting and abdominal distention.  All other systems reviewed and are negative.     Allergies  Review of patient's allergies indicates no known allergies.  Home Medications   Prior to Admission medications   Medication Sig Start Date End Date Taking? Authorizing Provider  ibuprofen (ADVIL,MOTRIN) 800 MG tablet Take 1 tablet (800 mg total) by mouth 3 (three) times daily. 09/22/13   Elpidio Anis, PA-C  oxyCODONE-acetaminophen (PERCOCET/ROXICET) 5-325 MG per tablet Take 1-2 tablets by mouth every 6 (six) hours as needed for moderate pain. 09/30/14   Nonie Hoyer, PA-C   BP 162/99 mmHg  Pulse 83  Temp(Src) 98.2 F (36.8 C) (Oral)  Resp 22  Ht 5\' 10"  (1.778 m)  Wt 230 lb  (104.327 kg)  BMI 33.00 kg/m2  SpO2 97% Physical Exam  Constitutional: He is oriented to person, place, and time.  Uncomfortable   HENT:  Head: Normocephalic.  Mouth/Throat: Oropharynx is clear and moist.  Eyes: Conjunctivae are normal. Pupils are equal, round, and reactive to light.  Neck: Normal range of motion. Neck supple.  Cardiovascular: Normal rate, regular rhythm and normal heart sounds.   Pulmonary/Chest: Effort normal and breath sounds normal. No respiratory distress. He has no wheezes. He has no rales.  Abdominal:  Distended, + Bowel sounds, mild diffuse tenderness. Mild erythema on abdomen, no obvious wound infection   Musculoskeletal: Normal range of motion.  Neurological: He is alert and oriented to person, place, and time.  Skin: Skin is warm and dry.  Psychiatric: He has a normal mood and affect. His behavior is normal. Judgment and thought content normal.  Nursing note and vitals reviewed.   ED Course  Procedures (including critical care time) Labs Review Labs Reviewed  CBC - Abnormal; Notable for the following:    WBC 13.7 (*)    All other components within normal limits  LIPASE, BLOOD  COMPREHENSIVE METABOLIC PANEL  URINALYSIS, ROUTINE W REFLEX MICROSCOPIC (NOT AT [facility])    Imaging Review Ct Abdomen Pelvis W Contrast  09/29/2014   CLINICAL DATA:  Right lower quadrant pain starting 1 day ago.  EXAM: CT ABDOMEN AND PELVIS WITH CONTRAST  TECHNIQUE: Multidetector CT imaging of the abdomen and pelvis was performed using the standard protocol following bolus administration of intravenous contrast.  CONTRAST:  80mL OMNIPAQUE IOHEXOL 300 MG/ML  SOLN  COMPARISON:  None.  FINDINGS: Lower chest:  Unremarkable.  Hepatobiliary: No masses or other significant abnormality identified.  Pancreas: No evidence of mass, inflammatory changes, or other significant abnormality.  Spleen:  Within normal limits in size and appearance.  Adrenal Glands:  No masses identified.   Kidneys/Urinary Tract: No evidence of urolithiasis or hydronephrosis. No solid or complex cystic renal masses identified. No masses or calculi seen involving the lower urinary tract.  Stomach/Bowel/Peritoneum: No evidence of wall thickening, mass, or obstruction in the upper gastrointestinal tract.  Vascular/Lymphatic: No pathologically enlarged lymph nodes identified. No other significant abnormality noted.  Reproductive: No masses or other significant abnormality identified.  Other: The appendix is thickened to 1.1 cm and contains an appendicolith proximally. Surrounding inflammatory fat stranding is seen. There is no evidence of rupture.  Musculoskeletal:  No suspicious bone lesions identified.  IMPRESSION: Acute appendicitis.  Surgical consult is recommended.  Otherwise normal appearance of the abdomen and pelvis.  These results will be called to the ordering clinician or representative by the Radiologist Assistant, and communication documented in the PACS or zVision Dashboard.   Electronically Signed   By: Ted Mcalpine M.D.   On: 09/29/2014 19:17   Dg Abd Acute W/chest  10/01/2014   CLINICAL DATA:  Vomiting and chest pain since last week.  EXAM: DG ABDOMEN ACUTE W/ 1V CHEST  COMPARISON:  CT of the abdomen dated 09/29/2014  FINDINGS: Normal heart size and pulmonary vascularity. No focal airspace disease or consolidation in the lungs. No blunting of costophrenic angles. No pneumothorax. Mediastinal contours appear intact.  Scattered gas and stool in the colon. There has been interval development of diffuse distention of small bowel loops measuring up to 4.6 cm in cross-section, with gas fluid levels. Gaseous distension of the colon is noted, with residual oral contrast throughout the colon. No free intra-abdominal air. No radiopaque stones. Visualized bones appear intact.  IMPRESSION: Interval development of early/incomplete small bowel obstruction or severe ileus.  Low lung volumes, otherwise no  evidence of acute cardiopulmonary disease.  These results were called by telephone at the time of interpretation on 10/01/2014 at 5:27 pm to Dr. Silverio Lay , who verbally acknowledged these results.   Electronically Signed   By: Ted Mcalpine M.D.   On: 10/01/2014 17:30   I have personally reviewed and evaluated these images and lab results as part of my medical decision-making.   EKG Interpretation None      MDM   Final diagnoses:  None   Neil L Williard is a 44 y.o. male here with ab pain, distention. Likely postop ileus. Will check labs, xrays. Will place NG tube.  6:10 PM Talked to Dr. Harlon Flor from surgery. He will see patient.     Richardean Canal, MD 10/01/14 [id number]

## 2014-10-01 NOTE — H&P (Signed)
Jay Sweeney is an 44 y.o. male.   Chief Complaint: Abdominal distention/ nausea and vomiting HPI: This is a 44 yo male who is two days s/p laparoscopic appendectomy by Dr. Kieth Brightly.  He was discharged 09/30/14.  Since going home, he has had multiple episodes of nausea and vomiting.  No BM.  Significant abdominal distention.  Has only used two pain meds since discharge.  Pathology showed MARKED ACUTE FULL THICKNESS APPENDICITIS WITH SEROSITIS AND PERIAPPENDICEAL ABSCESS FORMATION.  History reviewed. No pertinent past medical history.  Past Surgical History  Procedure Laterality Date  . Laparoscopic appendectomy N/A 09/29/2014    Procedure: APPENDECTOMY LAPAROSCOPIC;  Surgeon: Mickeal Skinner, MD;  Location: [place];  Service: General;  Laterality: N/A;    No family history on file. Social History:  reports that he has been smoking Cigarettes.  He does not have any smokeless tobacco history on file. He reports that he drinks alcohol. He reports that he does not use illicit drugs.  Allergies: No Known Allergies  Prior to Admission medications   Medication Sig Start Date End Date Taking? Authorizing Provider  ibuprofen (ADVIL,MOTRIN) 800 MG tablet Take 1 tablet (800 mg total) by mouth 3 (three) times daily. 09/22/13  Yes Charlann Lange, PA-C  oxyCODONE-acetaminophen (PERCOCET/ROXICET) 5-325 MG per tablet Take 1-2 tablets by mouth every 6 (six) hours as needed for moderate pain. 09/30/14  Yes Nat Christen, PA-C  Promethazine HCl (PHENERGAN PO) Take 1 tablet by mouth daily as needed (nausea).   Yes Historical Provider, MD     Results for orders placed or performed during the hospital encounter of 10/01/14 (from the past 48 hour(s))  Lipase, blood     Status: Abnormal   Collection Time: 10/01/14  5:10 PM  Result Value Ref Range   Lipase 14 (L) 22 - 51 U/L  Comprehensive metabolic panel     Status: Abnormal   Collection Time: 10/01/14  5:10 PM  Result Value Ref Range   Sodium 138 135 -  145 mmol/L   Potassium 3.9 3.5 - 5.1 mmol/L   Chloride 102 101 - 111 mmol/L   CO2 25 22 - 32 mmol/L   Glucose, Bld 111 (H) 65 - 99 mg/dL   BUN 14 6 - 20 mg/dL   Creatinine, Ser 1.18 0.61 - 1.24 mg/dL   Calcium 9.5 8.9 - 10.3 mg/dL   Total Protein 7.0 6.5 - 8.1 g/dL   Albumin 3.5 3.5 - 5.0 g/dL   AST 27 15 - 41 U/L   ALT 24 17 - 63 U/L   Alkaline Phosphatase 61 38 - 126 U/L   Total Bilirubin 0.7 0.3 - 1.2 mg/dL   GFR calc non Af Amer >60 >60 mL/min   GFR calc Af Amer >60 >60 mL/min    Comment: (NOTE) The eGFR has been calculated using the CKD EPI equation. This calculation has not been validated in all clinical situations. eGFR's persistently <60 mL/min signify possible Chronic Kidney Disease.    Anion gap 11 5 - 15  CBC     Status: Abnormal   Collection Time: 10/01/14  5:10 PM  Result Value Ref Range   WBC 13.7 (H) 4.0 - 10.5 K/uL   RBC 5.00 4.22 - 5.81 MIL/uL   Hemoglobin 15.1 13.0 - 17.0 g/dL   HCT 43.5 39.0 - 52.0 %   MCV 87.0 78.0 - 100.0 fL   MCH 30.2 26.0 - 34.0 pg   MCHC 34.7 30.0 - 36.0 g/dL   RDW  13.3 11.5 - 15.5 %   Platelets 266 150 - 400 K/uL   Ct Abdomen Pelvis W Contrast  09/29/2014   CLINICAL DATA:  Right lower quadrant pain starting 1 day ago.  EXAM: CT ABDOMEN AND PELVIS WITH CONTRAST  TECHNIQUE: Multidetector CT imaging of the abdomen and pelvis was performed using the standard protocol following bolus administration of intravenous contrast.  CONTRAST:  45m OMNIPAQUE IOHEXOL 300 MG/ML  SOLN  COMPARISON:  None.  FINDINGS: Lower chest:  Unremarkable.  Hepatobiliary: No masses or other significant abnormality identified.  Pancreas: No evidence of mass, inflammatory changes, or other significant abnormality.  Spleen:  Within normal limits in size and appearance.  Adrenal Glands:  No masses identified.  Kidneys/Urinary Tract: No evidence of urolithiasis or hydronephrosis. No solid or complex cystic renal masses identified. No masses or calculi seen involving the  lower urinary tract.  Stomach/Bowel/Peritoneum: No evidence of wall thickening, mass, or obstruction in the upper gastrointestinal tract.  Vascular/Lymphatic: No pathologically enlarged lymph nodes identified. No other significant abnormality noted.  Reproductive: No masses or other significant abnormality identified.  Other: The appendix is thickened to 1.1 cm and contains an appendicolith proximally. Surrounding inflammatory fat stranding is seen. There is no evidence of rupture.  Musculoskeletal:  No suspicious bone lesions identified.  IMPRESSION: Acute appendicitis.  Surgical consult is recommended.  Otherwise normal appearance of the abdomen and pelvis.  These results will be called to the ordering clinician or representative by the Radiologist Assistant, and communication documented in the PACS or zVision Dashboard.   Electronically Signed   By: DFidela SalisburyM.D.   On: 09/29/2014 19:17   Dg Abd Acute W/chest  10/01/2014   CLINICAL DATA:  Vomiting and chest pain since last week.  EXAM: DG ABDOMEN ACUTE W/ 1V CHEST  COMPARISON:  CT of the abdomen dated 09/29/2014  FINDINGS: Normal heart size and pulmonary vascularity. No focal airspace disease or consolidation in the lungs. No blunting of costophrenic angles. No pneumothorax. Mediastinal contours appear intact.  Scattered gas and stool in the colon. There has been interval development of diffuse distention of small bowel loops measuring up to 4.6 cm in cross-section, with gas fluid levels. Gaseous distension of the colon is noted, with residual oral contrast throughout the colon. No free intra-abdominal air. No radiopaque stones. Visualized bones appear intact.  IMPRESSION: Interval development of early/incomplete small bowel obstruction or severe ileus.  Low lung volumes, otherwise no evidence of acute cardiopulmonary disease.  These results were called by telephone at the time of interpretation on 10/01/2014 at 5:27 pm to Dr. YDarl Householder, who verbally  acknowledged these results.   Electronically Signed   By: DFidela SalisburyM.D.   On: 10/01/2014 17:30    Review of Systems  Constitutional: Negative for weight loss.  HENT: Negative for ear discharge, ear pain, hearing loss and tinnitus.   Eyes: Negative for blurred vision, double vision, photophobia and pain.  Respiratory: Negative for cough, sputum production and shortness of breath.   Cardiovascular: Negative for chest pain.  Gastrointestinal: Positive for nausea, vomiting and abdominal pain.  Genitourinary: Negative for dysuria, urgency, frequency and flank pain.  Musculoskeletal: Negative for myalgias, back pain, joint pain, falls and neck pain.  Neurological: Negative for dizziness, tingling, sensory change, focal weakness, loss of consciousness and headaches.  Endo/Heme/Allergies: Does not bruise/bleed easily.  Psychiatric/Behavioral: Negative for depression, memory loss and substance abuse. The patient is not nervous/anxious.     Blood pressure 162/93, pulse 72, temperature  98.2 F (36.8 C), temperature source Oral, resp. rate 22, height 5' 10" (1.778 m), weight 104.327 kg (230 lb), SpO2 97 %. Physical Exam  WDWN in NAD HEENT:  EOMI, sclera anicteric Neck:  No masses, no thyromegaly Lungs:  CTA bilaterally; normal respiratory effort CV:  Regular rate and rhythm; no murmurs Abd:  Distended, minimal bowel sounds; incisions c/d/i Minimal tenderness Ext:  Well-perfused; no edema Skin:  Warm, dry; no sign of jaundice  Assessment/Plan 1.  Post-operative ileus s/p recent appendectomy  Admit for bowel rest/ NG tube IV hydration Minimize narcotics Repeat films in AM.   Deven Furia K. 10/01/2014, 6:34 PM

## 2014-10-01 NOTE — ED Notes (Signed)
Pt reports having his appendix removed on Monday night. Pt reports N/V and hiccups since last night. Pt took phenegran with no relief. Pt reports abdominal distended. Reports tenderness to palpation. Denies drainage from incision sites. Pt has not had a bowel movement or passed gas since surgery.

## 2014-10-01 NOTE — ED Notes (Signed)
Pt requesting medication for anxiety. Dr. Silverio Lay aware

## 2014-10-02 ENCOUNTER — Inpatient Hospital Stay (HOSPITAL_COMMUNITY): Payer: Self-pay

## 2014-10-02 LAB — URINALYSIS, ROUTINE W REFLEX MICROSCOPIC
BILIRUBIN URINE: NEGATIVE
GLUCOSE, UA: NEGATIVE mg/dL
HGB URINE DIPSTICK: NEGATIVE
KETONES UR: 40 mg/dL — AB
LEUKOCYTES UA: NEGATIVE
Nitrite: NEGATIVE
PH: 7 (ref 5.0–8.0)
PROTEIN: 30 mg/dL — AB
Specific Gravity, Urine: 1.035 — ABNORMAL HIGH (ref 1.005–1.030)
Urobilinogen, UA: 0.2 mg/dL (ref 0.0–1.0)

## 2014-10-02 LAB — URINE MICROSCOPIC-ADD ON

## 2014-10-02 LAB — BASIC METABOLIC PANEL
Anion gap: 8 (ref 5–15)
BUN: 13 mg/dL (ref 6–20)
CALCIUM: 8.3 mg/dL — AB (ref 8.9–10.3)
CO2: 26 mmol/L (ref 22–32)
CREATININE: 1.17 mg/dL (ref 0.61–1.24)
Chloride: 104 mmol/L (ref 101–111)
GFR calc Af Amer: >60 mL/min (ref >60)
GLUCOSE: 100 mg/dL — AB (ref 65–99)
Potassium: 3.6 mmol/L (ref 3.5–5.1)
Sodium: 138 mmol/L (ref 135–145)

## 2014-10-02 LAB — CBC
HCT: 40.4 % (ref 39.0–52.0)
Hemoglobin: 13.7 g/dL (ref 13.0–17.0)
MCH: 30.2 pg (ref 26.0–34.0)
MCHC: 33.9 g/dL (ref 30.0–36.0)
MCV: 89 fL (ref 78.0–100.0)
PLATELETS: 234 10*3/uL (ref 150–400)
RBC: 4.54 MIL/uL (ref 4.22–5.81)
RDW: 13.3 % (ref 11.5–15.5)
WBC: 11.8 10*3/uL — ABNORMAL HIGH (ref 4.0–10.5)

## 2014-10-02 MED ORDER — METOCLOPRAMIDE HCL 5 MG/ML IJ SOLN
10.0000 mg | Freq: Four times a day (QID) | INTRAMUSCULAR | Status: AC
  Administered 2014-10-02 (×3): 10 mg via INTRAVENOUS
  Filled 2014-10-02 (×3): qty 2

## 2014-10-02 NOTE — Progress Notes (Signed)
When NT entered room, pt told her his tube came out.  Pt has no complaints of nauseas at this time. 300cc of clear/yellowish liquid removed from NG from total 12 hr shift.  Will make MD aware. [name]

## 2014-10-02 NOTE — Progress Notes (Signed)
[facility] Progress Note     Subjective: Feels better now that NG out. He pulled it out.  C/o feeling bloated, but no N/V or much abdominal pain.  Having flatus, no BM yet.  Objective: Vital signs in last 24 hours: Temp:  [98.2 F (36.8 C)-98.6 F (37 C)] 98.2 F (36.8 C) (09/01 0641) Pulse Rate:  [68-88] 84 (09/01 0641) Resp:  [16-22] 20 (09/01 0641) BP: (152-194)/(93-100) 169/99 mmHg (09/01 0641) SpO2:  [92 %-97 %] 94 % (09/01 0641) Weight:  [104.327 kg (230 lb)] 104.327 kg (230 lb) (08/31 1649) Last BM Date: 09/28/14  Intake/Output from previous day: 08/31 0701 - 09/01 0700 In: -  Out: 1475 [Urine:475; Emesis/NG output:1000] Intake/Output this shift:    PE: Gen:  Alert, NAD, pleasant Abd: Soft, distended, mild tenderness, +BS, no HSM, incisions C/D/I   Lab Results:   Recent Labs  10/01/14 1710 10/02/14 0503  WBC 13.7* 11.8*  HGB 15.1 13.7  HCT 43.5 40.4  PLT 266 234   BMET  Recent Labs  10/01/14 1710 10/02/14 0503  NA 138 138  K 3.9 3.6  CL 102 104  CO2 25 26  GLUCOSE 111* 100*  BUN 14 13  CREATININE 1.18 1.17  CALCIUM 9.5 8.3*   PT/INR No results for input(s): LABPROT, INR in the last 72 hours. CMP     Component Value Date/Time   NA 138 10/02/2014 0503   K 3.6 10/02/2014 0503   CL 104 10/02/2014 0503   CO2 26 10/02/2014 0503   GLUCOSE 100* 10/02/2014 0503   BUN 13 10/02/2014 0503   CREATININE 1.17 10/02/2014 0503   CALCIUM 8.3* 10/02/2014 0503   PROT 7.0 10/01/2014 1710   ALBUMIN 3.5 10/01/2014 1710   AST 27 10/01/2014 1710   ALT 24 10/01/2014 1710   ALKPHOS 61 10/01/2014 1710   BILITOT 0.7 10/01/2014 1710   GFRNONAA >60 10/02/2014 0503   GFRAA >60 10/02/2014 0503   Lipase     Component Value Date/Time   LIPASE 14* 10/01/2014 1710       Studies/Results: Dg Abd Acute W/chest  10/01/2014   CLINICAL DATA:  Vomiting and chest pain since last week.  EXAM: DG ABDOMEN ACUTE W/ 1V CHEST  COMPARISON:  CT of the  abdomen dated 09/29/2014  FINDINGS: Normal heart size and pulmonary vascularity. No focal airspace disease or consolidation in the lungs. No blunting of costophrenic angles. No pneumothorax. Mediastinal contours appear intact.  Scattered gas and stool in the colon. There has been interval development of diffuse distention of small bowel loops measuring up to 4.6 cm in cross-section, with gas fluid levels. Gaseous distension of the colon is noted, with residual oral contrast throughout the colon. No free intra-abdominal air. No radiopaque stones. Visualized bones appear intact.  IMPRESSION: Interval development of early/incomplete small bowel obstruction or severe ileus.  Low lung volumes, otherwise no evidence of acute cardiopulmonary disease.  These results were called by telephone at the time of interpretation on 10/01/2014 at 5:27 pm to Dr. Silverio Lay , who verbally acknowledged these results.   Electronically Signed   By: Ted Mcalpine M.D.   On: 10/01/2014 17:30   Dg Abd Portable 2v  10/02/2014   CLINICAL DATA:  Postoperative ileus  EXAM: PORTABLE ABDOMEN - 2 VIEW  COMPARISON:  10/01/2014  FINDINGS: Scattered gas and stool within nondistended colon to rectum.  Persistent dilated small bowel loops in mid abdomen favor postoperative ileus.  No bowel wall thickening or free intraperitoneal air.  Tip of nasogastric tube projects over proximal stomach.  Osseous structures grossly normal in appearance.  IMPRESSION: Probable postoperative ileus.   Electronically Signed   By: Ulyses Southward M.D.   On: 10/02/2014 07:54    Anti-infectives: Anti-infectives    Start     Dose/Rate Route Frequency Ordered Stop   10/01/14 2100  metroNIDAZOLE (FLAGYL) IVPB 500 mg     500 mg 100 mL/hr over 60 Minutes Intravenous Every 8 hours 10/01/14 1944     10/01/14 2000  cefTRIAXone (ROCEPHIN) 2 g in dextrose 5 % 50 mL IVPB     2 g 100 mL/hr over 30 Minutes Intravenous Every 24 hours 10/01/14 1944         Assessment/Plan Post  operative ileus s/p recent appendectomy -Admit for bowel rest.  He pulled NG out.  Allow sips of clears and ice -IV hydration -Minimize narcotics -Films still show ileus -Continue to monitor -Ambulate and IS -SCD's and lovenox    LOS: 1 day    Nonie Hoyer 10/02/2014, 8:16 AM Pager: [phone]

## 2014-10-02 NOTE — Progress Notes (Signed)
MD Tsuei stated ok to leave NG out for now. Jay Sweeney

## 2014-10-03 MED ORDER — WHITE PETROLATUM GEL
Status: AC
  Administered 2014-10-03: 15:00:00
  Filled 2014-10-03: qty 1

## 2014-10-03 MED ORDER — WHITE PETROLATUM GEL
Status: DC | PRN
  Administered 2014-10-03: 0.2 via TOPICAL

## 2014-10-03 MED ORDER — METOCLOPRAMIDE HCL 5 MG/ML IJ SOLN
10.0000 mg | Freq: Four times a day (QID) | INTRAMUSCULAR | Status: AC
  Administered 2014-10-03 (×3): 10 mg via INTRAVENOUS
  Filled 2014-10-03 (×2): qty 2

## 2014-10-03 NOTE — Progress Notes (Signed)
[facility] Progress Note     Subjective: Feels bloated/tight.  Anorexic due to bloating.  No N/V, abdominal pain due to bloating.  Incisions okay.  Had a BM yesterday, passing flatus.  Objective: Vital signs in last 24 hours: Temp:  [98.4 F (36.9 C)-99.8 F (37.7 C)] 98.9 F (37.2 C) (09/02 8119) Pulse Rate:  [71-77] 71 (09/02 0633) Resp:  [18] 18 (09/02 1478) BP: (137-173)/(71-102) 137/71 mmHg (09/02 0633) SpO2:  [97 %-98 %] 98 % (09/02 2956) Last BM Date: 10/02/14  Intake/Output from previous day: 09/01 0701 - 09/02 0700 In: 3530 [P.O.:1080; I.V.:2000; IV Piggyback:450] Out: 400 [Urine:400] Intake/Output this shift:    PE: Gen:  Alert, NAD, pleasant Abd: Soft, distended, mild tenderness, +BS, no HSM, incisions C/D/I   Lab Results:   Recent Labs  10/01/14 1710 10/02/14 0503  WBC 13.7* 11.8*  HGB 15.1 13.7  HCT 43.5 40.4  PLT 266 234   BMET  Recent Labs  10/01/14 1710 10/02/14 0503  NA 138 138  K 3.9 3.6  CL 102 104  CO2 25 26  GLUCOSE 111* 100*  BUN 14 13  CREATININE 1.18 1.17  CALCIUM 9.5 8.3*   PT/INR No results for input(s): LABPROT, INR in the last 72 hours. CMP     Component Value Date/Time   NA 138 10/02/2014 0503   K 3.6 10/02/2014 0503   CL 104 10/02/2014 0503   CO2 26 10/02/2014 0503   GLUCOSE 100* 10/02/2014 0503   BUN 13 10/02/2014 0503   CREATININE 1.17 10/02/2014 0503   CALCIUM 8.3* 10/02/2014 0503   PROT 7.0 10/01/2014 1710   ALBUMIN 3.5 10/01/2014 1710   AST 27 10/01/2014 1710   ALT 24 10/01/2014 1710   ALKPHOS 61 10/01/2014 1710   BILITOT 0.7 10/01/2014 1710   GFRNONAA >60 10/02/2014 0503   GFRAA >60 10/02/2014 0503   Lipase     Component Value Date/Time   LIPASE 14* 10/01/2014 1710       Studies/Results: Dg Abd Acute W/chest  10/01/2014   CLINICAL DATA:  Vomiting and chest pain since last week.  EXAM: DG ABDOMEN ACUTE W/ 1V CHEST  COMPARISON:  CT of the abdomen dated 09/29/2014  FINDINGS: Normal  heart size and pulmonary vascularity. No focal airspace disease or consolidation in the lungs. No blunting of costophrenic angles. No pneumothorax. Mediastinal contours appear intact.  Scattered gas and stool in the colon. There has been interval development of diffuse distention of small bowel loops measuring up to 4.6 cm in cross-section, with gas fluid levels. Gaseous distension of the colon is noted, with residual oral contrast throughout the colon. No free intra-abdominal air. No radiopaque stones. Visualized bones appear intact.  IMPRESSION: Interval development of early/incomplete small bowel obstruction or severe ileus.  Low lung volumes, otherwise no evidence of acute cardiopulmonary disease.  These results were called by telephone at the time of interpretation on 10/01/2014 at 5:27 pm to Dr. Silverio Lay , who verbally acknowledged these results.   Electronically Signed   By: Ted Mcalpine M.D.   On: 10/01/2014 17:30   Dg Abd Portable 2v  10/02/2014   CLINICAL DATA:  Postoperative ileus  EXAM: PORTABLE ABDOMEN - 2 VIEW  COMPARISON:  10/01/2014  FINDINGS: Scattered gas and stool within nondistended colon to rectum.  Persistent dilated small bowel loops in mid abdomen favor postoperative ileus.  No bowel wall thickening or free intraperitoneal air.  Tip of nasogastric tube projects over proximal stomach.  Osseous structures grossly  normal in appearance.  IMPRESSION: Probable postoperative ileus.   Electronically Signed   By: Ulyses Southward M.D.   On: 10/02/2014 07:54    Anti-infectives: Anti-infectives    Start     Dose/Rate Route Frequency Ordered Stop   10/01/14 2100  metroNIDAZOLE (FLAGYL) IVPB 500 mg     500 mg 100 mL/hr over 60 Minutes Intravenous Every 8 hours 10/01/14 1944     10/01/14 2000  cefTRIAXone (ROCEPHIN) 2 g in dextrose 5 % 50 mL IVPB     2 g 100 mL/hr over 30 Minutes Intravenous Every 24 hours 10/01/14 1944         Assessment/Plan Post operative ileus s/p recent  appendectomy -Tolerating clears, but still quite bloated, anorexic this am -IV hydration -Minimize narcotics -Films still show ileus -Ambulate and IS -SCD's and lovenox -3 more doses of reglan today    LOS: 2 days    Nonie Hoyer 10/03/2014, 8:12 AM Pager: [phone]

## 2014-10-04 MED ORDER — OXYCODONE-ACETAMINOPHEN 5-325 MG PO TABS: 1.0000 | ORAL_TABLET | ORAL | Status: DC | PRN

## 2014-10-04 NOTE — Progress Notes (Signed)
Discharge instructions gone over with patient. Follow up appointment to be made. Home medications gone over. Prescription given. Diet, reasons to call the doctor, and signs and symptoms of worsening condition discussed. My chart discussed. Patient verbalized understanding of instructions.

## 2014-10-04 NOTE — Discharge Summary (Signed)
Physician Discharge Summary  Patient ID: Jay Sweeney MRN: [id number] DOB/AGE: Jun 10, 1970 43 y.o.  Admit date: 10/01/2014 Discharge date: 10/04/2014  Admission Diagnoses:  Post lap appy ileus  Discharge Diagnoses:  same  Active Problems:   Postoperative ileus   Surgery:  none  Discharged Condition: improved  Hospital Course:   Patient was a readmit with increased abdominal girth;  Following admission, he began passing flatus and stools.  Ready for discharge   Consults: none  Significant Diagnostic Studies: none    Discharge Exam: Blood pressure 151/91, pulse 65, temperature 98.9 F (37.2 C), temperature source Oral, resp. rate 20, height  (1.778 m), weight 104.327 kg (230 lb), SpO2 97 %. Lap incisions covered  Disposition: 01-Home or Self Care  Discharge Instructions    Diet - low sodium heart healthy    Complete by:  As directed      Discharge instructions    Complete by:  As directed   May shower when home     Increase activity slowly    Complete by:  As directed             Medication List    TAKE these medications        ibuprofen 800 MG tablet  Commonly known as:  ADVIL,MOTRIN  Take 1 tablet (800 mg total) by mouth 3 (three) times daily.     oxyCODONE-acetaminophen 5-325 MG per tablet  Commonly known as:  PERCOCET/ROXICET  Take 1 tablet by mouth every 4 (four) hours as needed for moderate pain.     PHENERGAN PO  Take 1 tablet by mouth daily as needed (nausea).           Follow-up Information    Follow up with Rodman Pickle, MD In 1 week.   Specialty:  General Surgery   Contact information:   [address] [place] [address] [phone]       Signed: Valarie Merino 10/04/2014, 12:24 PM

## 2016-08-15 IMAGING — CR DG ABDOMEN ACUTE W/ 1V CHEST
3 series · 3 of 3 positions shown · non-contrast
Comparison: CT of the abdomen dated 09/29/2014

CLINICAL DATA: Vomiting and chest pain since last week.

EXAM:
DG ABDOMEN ACUTE W/ 1V CHEST

[chest pa]
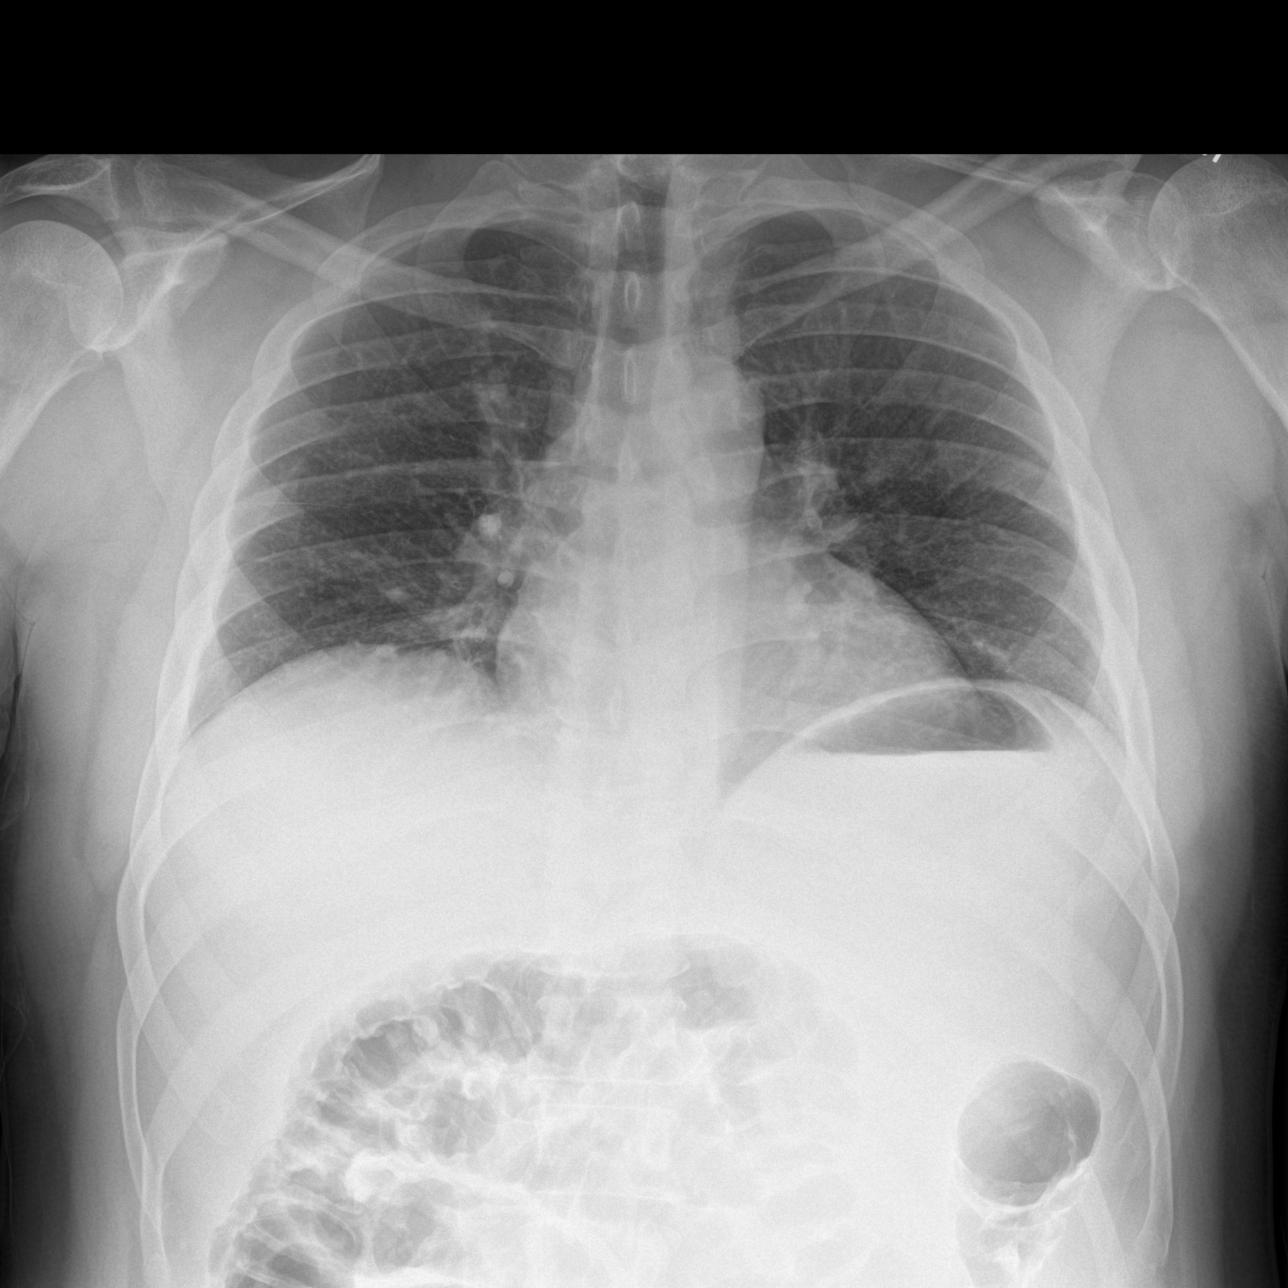

[abdomen erect]
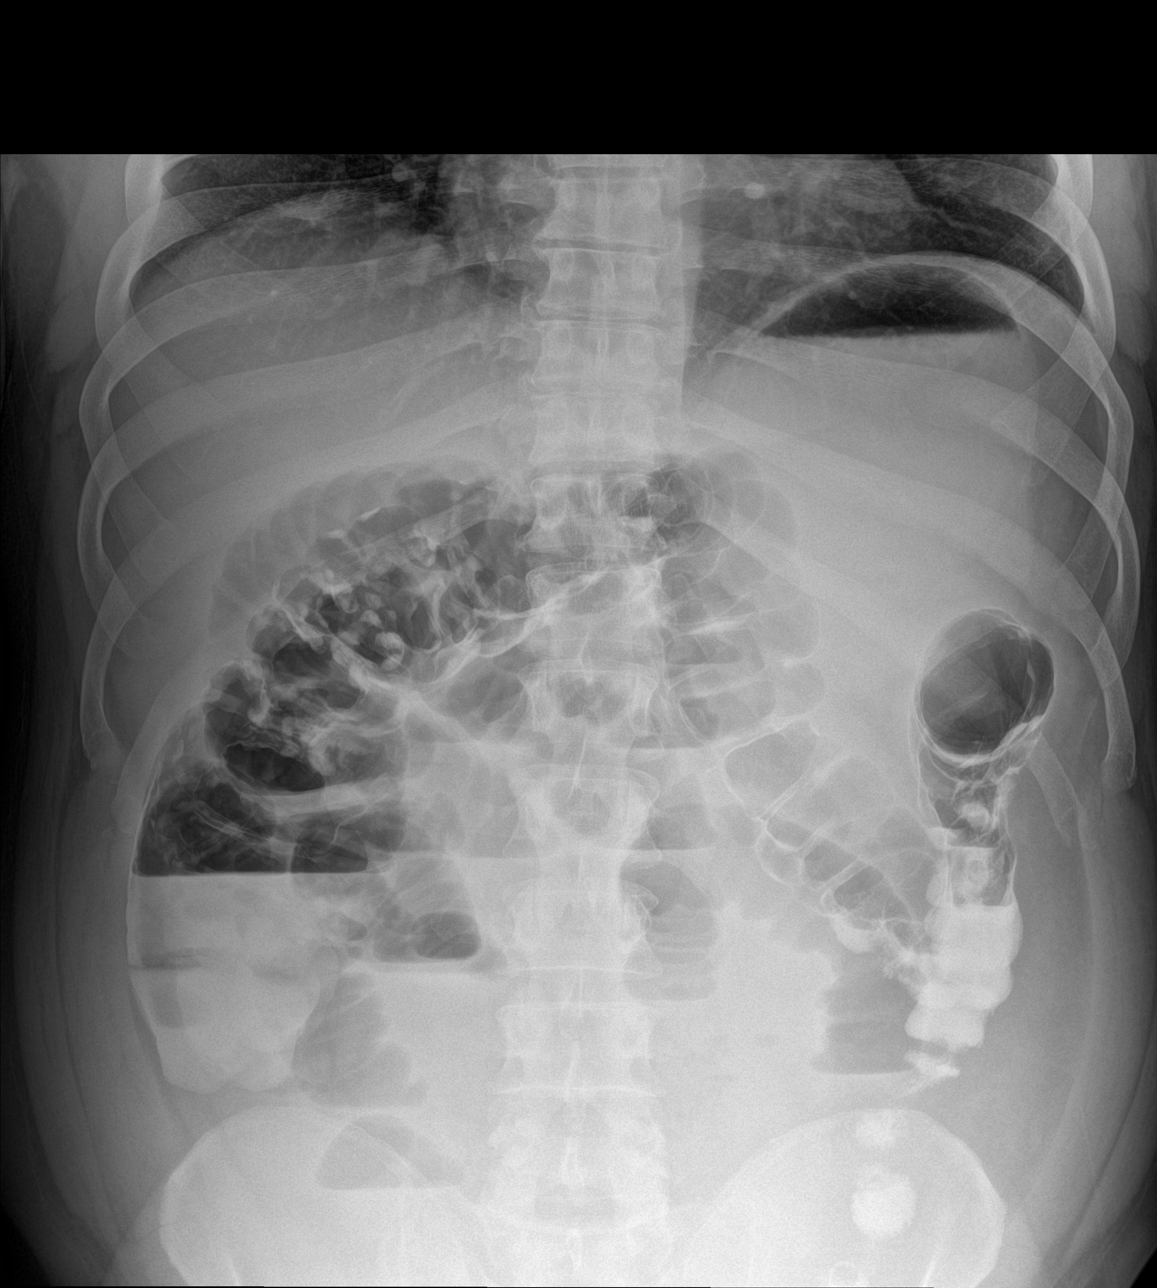

[abdomen supine]
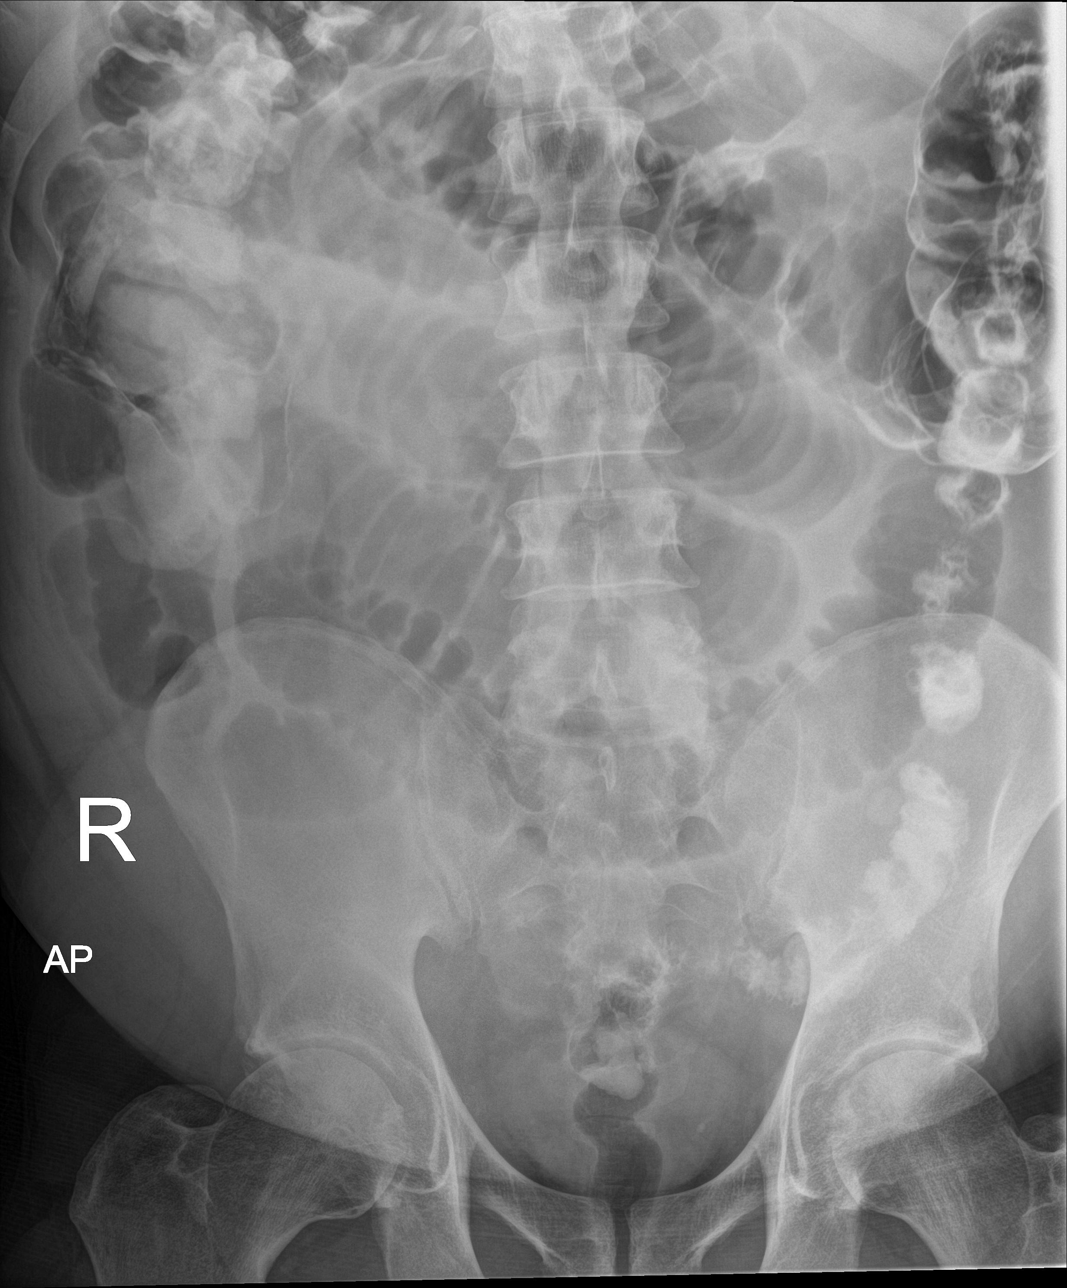

[3 of 3 positions shown; findings below may reference images not displayed]

FINDINGS: Normal heart size and pulmonary vascularity. No focal airspace
disease or consolidation in the lungs. No blunting of costophrenic
angles. No pneumothorax. Mediastinal contours appear intact.

Scattered gas and stool in the colon. There has been interval
development of diffuse distention of small bowel loops measuring up
to 4.6 cm in cross-section, with gas fluid levels. Gaseous
distension of the colon is noted, with residual oral contrast
throughout the colon. No free intra-abdominal air. No radiopaque
stones. Visualized bones appear intact.
IMPRESSION: Interval development of early/incomplete small bowel obstruction or
severe ileus.

Low lung volumes, otherwise no evidence of acute cardiopulmonary
disease.

These results were called by telephone at the time of interpretation
on 10/01/2014 at [DATE] to Dr. URVI , who verbally acknowledged these
results.

## 2016-08-16 IMAGING — CR DG ABD PORTABLE 2V
2 series · 2 of 2 positions shown · non-contrast
Comparison: 10/01/2014

CLINICAL DATA: Postoperative ileus

EXAM:
PORTABLE ABDOMEN - 2 VIEW

[AP]
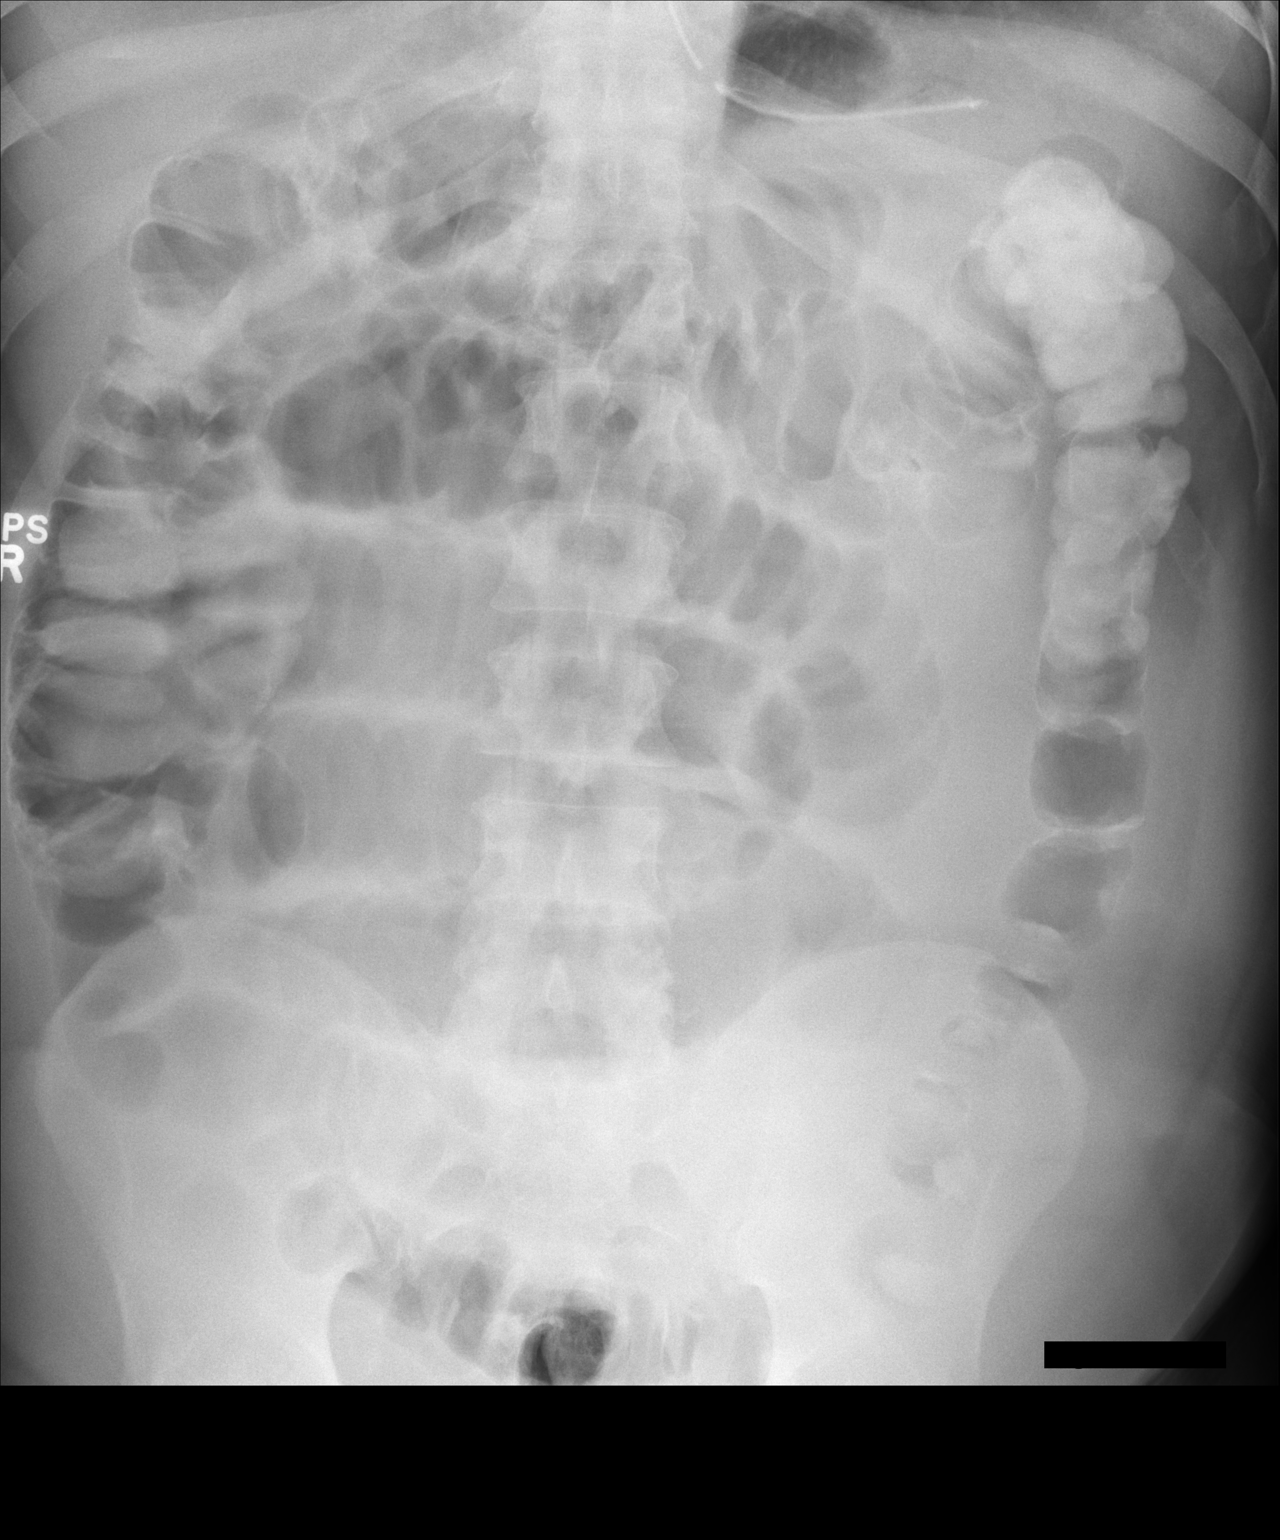

[ap lld]
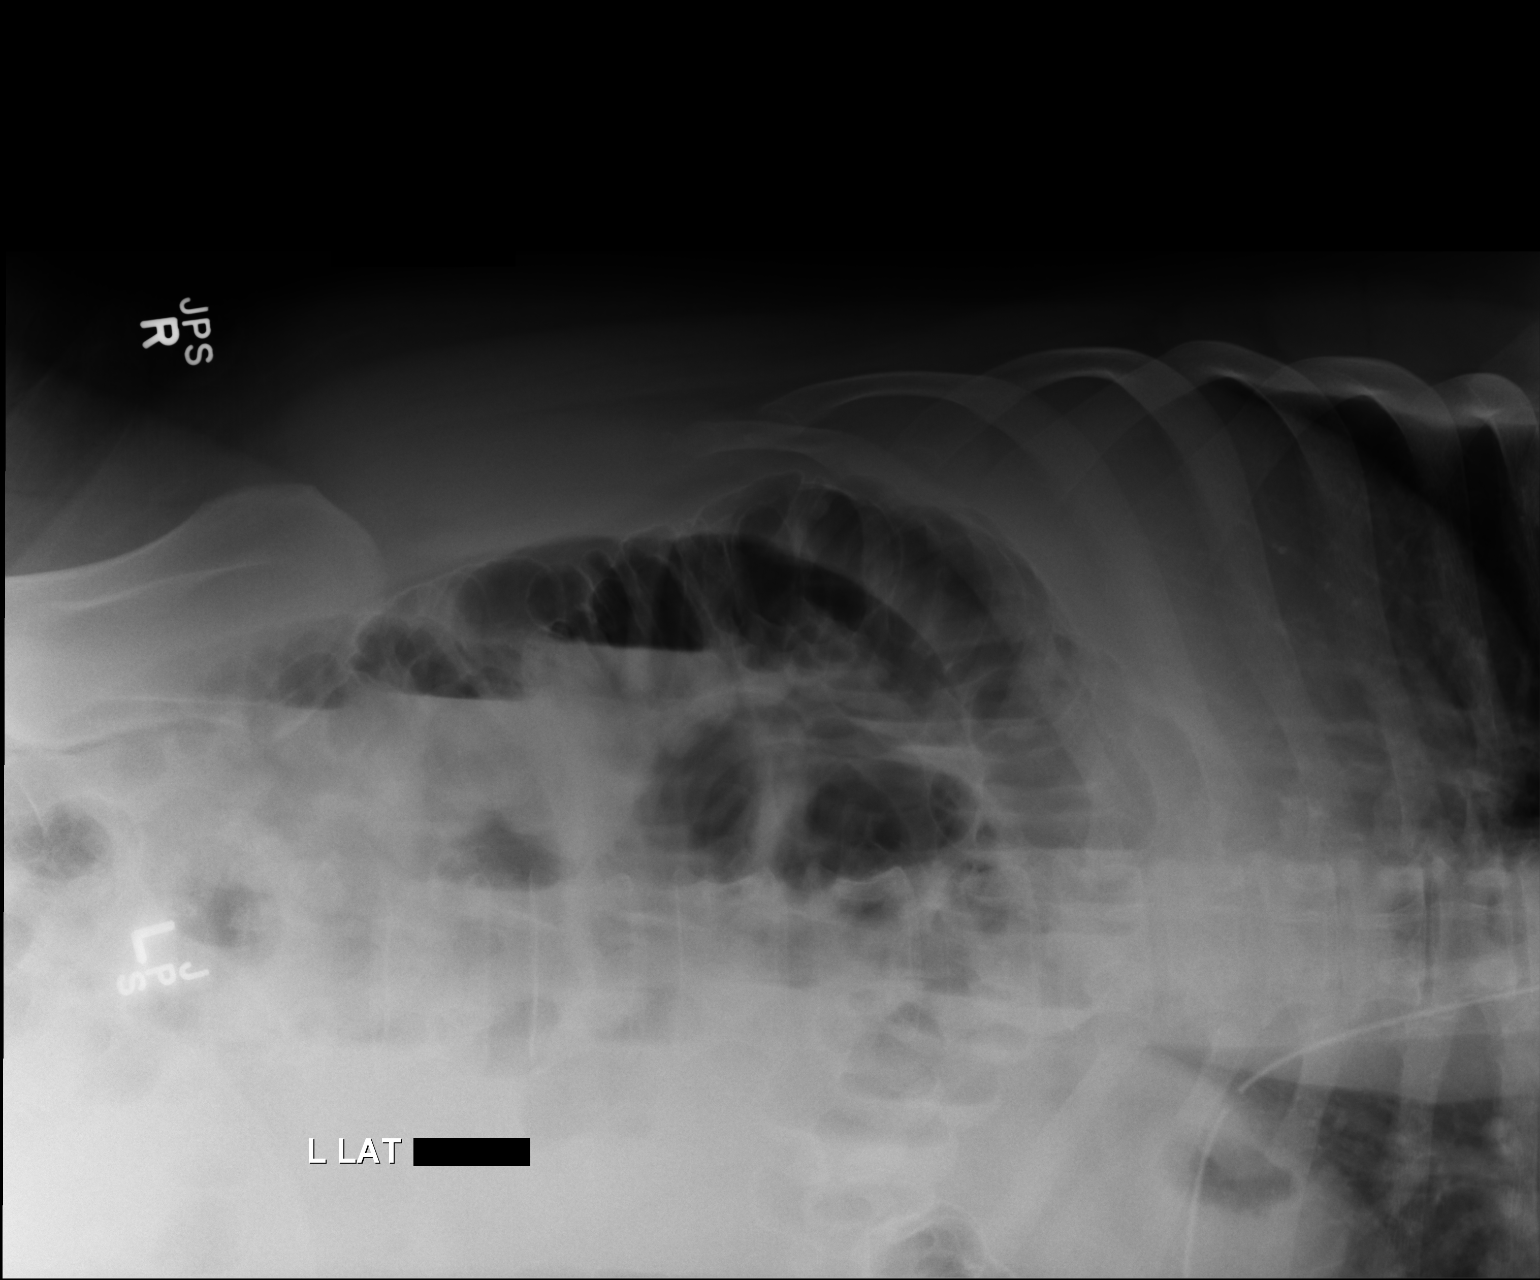

[2 of 2 positions shown; findings below may reference images not displayed]

FINDINGS: Scattered gas and stool within nondistended colon to rectum.

Persistent dilated small bowel loops in mid abdomen favor
postoperative ileus.

No bowel wall thickening or free intraperitoneal air.

Tip of nasogastric tube projects over proximal stomach.

Osseous structures grossly normal in appearance.
IMPRESSION: Probable postoperative ileus.

## 2021-07-05 ENCOUNTER — Ambulatory Visit (INDEPENDENT_AMBULATORY_CARE_PROVIDER_SITE_OTHER): Payer: 59 | Admitting: Family Medicine

## 2021-07-05 ENCOUNTER — Encounter: Payer: Self-pay | Admitting: Family Medicine

## 2021-07-05 VITALS — BP 162/104 | HR 73 | Temp 97.1°F | Ht 70.0 in | Wt 235.1 lb

## 2021-07-05 DIAGNOSIS — Z125 Encounter for screening for malignant neoplasm of prostate: Secondary | ICD-10-CM | POA: Diagnosis not present

## 2021-07-05 DIAGNOSIS — Z114 Encounter for screening for human immunodeficiency virus [HIV]: Secondary | ICD-10-CM

## 2021-07-05 DIAGNOSIS — Z1159 Encounter for screening for other viral diseases: Secondary | ICD-10-CM

## 2021-07-05 DIAGNOSIS — I10 Essential (primary) hypertension: Secondary | ICD-10-CM | POA: Insufficient documentation

## 2021-07-05 LAB — URINALYSIS, ROUTINE W REFLEX MICROSCOPIC
Hgb urine dipstick: NEGATIVE
Leukocytes,Ua: NEGATIVE
Nitrite: NEGATIVE
RBC / HPF: NONE SEEN (ref 0–?)
Specific Gravity, Urine: 1.025 (ref 1.000–1.030)
Total Protein, Urine: NEGATIVE
Urine Glucose: NEGATIVE
Urobilinogen, UA: 0.2 (ref 0.0–1.0)
pH: 6 (ref 5.0–8.0)

## 2021-07-05 LAB — CBC WITH DIFFERENTIAL/PLATELET
Basophils Absolute: 0 10*3/uL (ref 0.0–0.1)
Basophils Relative: 0.3 % (ref 0.0–3.0)
Eosinophils Absolute: 0.1 10*3/uL (ref 0.0–0.7)
Eosinophils Relative: 1.6 % (ref 0.0–5.0)
HCT: 45 % (ref 39.0–52.0)
Hemoglobin: 15 g/dL (ref 13.0–17.0)
Lymphocytes Relative: 30.8 % (ref 12.0–46.0)
Lymphs Abs: 2.5 10*3/uL (ref 0.7–4.0)
MCHC: 33.3 g/dL (ref 30.0–36.0)
MCV: 90.2 fl (ref 78.0–100.0)
Monocytes Absolute: 0.7 10*3/uL (ref 0.1–1.0)
Monocytes Relative: 8.8 % (ref 3.0–12.0)
Neutro Abs: 4.8 10*3/uL (ref 1.4–7.7)
Neutrophils Relative %: 58.5 % (ref 43.0–77.0)
Platelets: 252 10*3/uL (ref 150.0–400.0)
RBC: 4.99 Mil/uL (ref 4.22–5.81)
RDW: 14.9 % (ref 11.5–15.5)
WBC: 8.2 10*3/uL (ref 4.0–10.5)

## 2021-07-05 LAB — COMPREHENSIVE METABOLIC PANEL
ALT: 25 U/L (ref 0–53)
AST: 18 U/L (ref 0–37)
Albumin: 4.4 g/dL (ref 3.5–5.2)
Alkaline Phosphatase: 62 U/L (ref 39–117)
BUN: 15 mg/dL (ref 6–23)
CO2: 26 mEq/L (ref 19–32)
Calcium: 9.4 mg/dL (ref 8.4–10.5)
Chloride: 104 mEq/L (ref 96–112)
Creatinine, Ser: 1.16 mg/dL (ref 0.40–1.50)
GFR: 72.4 mL/min (ref >60.00)
Glucose, Bld: 97 mg/dL (ref 70–99)
Potassium: 4 mEq/L (ref 3.5–5.1)
Sodium: 140 mEq/L (ref 135–145)
Total Bilirubin: 0.8 mg/dL (ref 0.2–1.2)
Total Protein: 6.7 g/dL (ref 6.0–8.3)

## 2021-07-05 LAB — MICROALBUMIN / CREATININE URINE RATIO
Creatinine,U: 252 mg/dL
Microalb Creat Ratio: 0.6 mg/g (ref 0.0–30.0)
Microalb, Ur: 1.5 mg/dL (ref 0.0–1.9)

## 2021-07-05 LAB — LIPID PANEL
Cholesterol: 180 mg/dL (ref 0–200)
HDL: 59.6 mg/dL (ref >39.00)
LDL Cholesterol: 99 mg/dL (ref 0–99)
NonHDL: 120.73
Total CHOL/HDL Ratio: 3
Triglycerides: 111 mg/dL (ref 0.0–149.0)
VLDL: 22.2 mg/dL (ref 0.0–40.0)

## 2021-07-05 LAB — TSH: TSH: 1.35 u[IU]/mL (ref 0.35–5.50)

## 2021-07-05 LAB — PSA: PSA: 0.51 ng/mL (ref 0.10–4.00)

## 2021-07-05 LAB — HEMOGLOBIN A1C: Hgb A1c MFr Bld: 5.6 % (ref 4.6–6.5)

## 2021-07-05 MED ORDER — AMLODIPINE BESYLATE 5 MG PO TABS: 5.0000 mg | ORAL_TABLET | Freq: Every day | ORAL | 1 refills | Status: DC

## 2021-07-05 NOTE — Patient Instructions (Signed)
Welcome to [facility]! It was a pleasure meeting you today.  As discussed, Please schedule a 1 month follow up visit today.  Get blood pressure cuff.   Bring with you at visit Walk daily.    PLEASE NOTE:  If you had any LAB tests please let us know if you have not heard back within a few days. You may see your results on MyChart before we have a chance to review them but we will give you a call once they are reviewed by [place]. If we ordered any REFERRALS today, please let us know if you have not heard from their office within the next week.  Let us know through MyChart if you are needing REFILLS, or have your pharmacy send [place] the request. You can also use MyChart to communicate with me or any office staff.  Please try these tips to maintain a healthy lifestyle:  Eat most of your calories during the day when you are active. Eliminate processed foods including packaged sweets (pies, cakes, cookies), reduce intake of potatoes, white bread, white pasta, and white rice. Look for whole grain options, oat flour or almond flour.  Each meal should contain half fruits/vegetables, one quarter protein, and one quarter carbs (no bigger than a computer mouse).  Cut down on sweet beverages. This includes juice, soda, and sweet tea. Also watch fruit intake, though this is a healthier sweet option, it still contains natural sugar! Limit to 3 servings daily.  Drink at least 1 glass of water with each meal and aim for at least 8 glasses per day  Exercise at least 150 minutes every week.

## 2021-07-05 NOTE — Progress Notes (Signed)
New Patient Office Visit  Subjective:  Patient ID: Jay Sweeney, male    DOB: [birth year]  Age: 51 y.o. MRN: [id number]  CC:  Chief Complaint  Patient presents with   Establish Care    Need new PCP Fasting     HPI Jay Sweeney presents for new pt-needs PCP Mom TIA, DM  New pt-elevated bp for past 1-2 yr-seeing ortho for injury and told elevated.  No symptoms.  History reviewed. No pertinent past medical history.  Past Surgical History:  Procedure Laterality Date   LAPAROSCOPIC APPENDECTOMY N/A 09/29/2014   Procedure: APPENDECTOMY LAPAROSCOPIC;  Surgeon: Rodman Pickle, MD;  Location: [facility] OR;  Service: General;  Laterality: N/A;    History reviewed. No pertinent family history.  Social History   Socioeconomic History   Marital status: Single    Spouse name: Not on file   Number of children: 2   Years of education: Not on file   Highest education level: Not on file  Occupational History   Not on file  Tobacco Use   Smoking status: Every Day    Packs/day: 0.50    Years: 5.00    Pack years: 2.50    Types: Cigarettes   Smokeless tobacco: Not on file  Vaping Use   Vaping Use: Never used  Substance and Sexual Activity   Alcohol use: Yes    Alcohol/week: 6.0 standard drinks    Types: 4 Cans of beer, 2 Shots of liquor per week    Comment: occassionally   Drug use: Not Currently    Types: Marijuana    Comment: used in the past   Sexual activity: Yes    Birth control/protection: None  Other Topics Concern   Not on file  Social History Narrative   Has long term male relationship      Unem-injury Social research officer, government   Social Determinants of Health   Financial Resource Strain: Not on file  Food Insecurity: Not on file  Transportation Needs: Not on file  Physical Activity: Not on file  Stress: Not on file  Social Connections: Not on file  Intimate Partner Violence: Not on file    ROS  ROS: Gen: no fever, chills  Skin: no rash, itching ENT: no ear  pain, ear drainage, nasal congestion, rhinorrhea, sinus pressure, sore throat Eyes: no blurry vision, double vision Resp: no cough, wheeze,SOB CV: no CP, palpitations, LE edema,    occ palp.-no symptoms.  Coffee 1+/d.  Nas.  No exercise.  GI: no heartburn, n/v/d/c, abd pain. No colon GU: no dysuria, urgency, frequency, hematuria.  No ED MSK: R wrist.  Fx ankle in 20's Neuro: no dizziness, headache, weakness, vertigo Psych: no depression, anxiety, insomnia, SI   Objective:   Today's Vitals: BP (!) 162/104   Pulse 73   Temp (!) 97.1 F (36.2 C) (Temporal)   Ht 5\' 10"  (1.778 m)   Wt 235 lb 2 oz (106.7 kg)   SpO2 96%   BMI 33.74 kg/m   Physical Exam 162/104 Gen: WDWN NAD OAAM HEENT: NCAT, conjunctiva not injected, sclera nonicteric TM WNL B, OP moist, no exudates  NECK:  supple, no thyromegaly, no nodes, no carotid bruits CARDIAC: RRR, S1S2+, no murmur. DP 2+B LUNGS: CTAB. No wheezes ABDOMEN:  BS+, soft, NTND, No HSM, no masses.  +hernia under umbilicus-sl tender EXT:  no edema MSK: no gross abnormalities. Has R wrist splint NEURO: A&O x3.  CN II-XII intact.  PSYCH: normal mood. Good eye contact  EKG:NSR.  No ST-changes. Rate 77.  RSR  Assessment & Plan:   Problem List Items Addressed This Visit       Cardiovascular and Mediastinum   Primary hypertension - Primary   Relevant Medications   amLODipine (NORVASC) 5 MG tablet   Other Relevant Orders   Comprehensive metabolic panel   Hemoglobin A1c   TSH   Lipid panel   CBC with Differential/Platelet   Microalbumin / creatinine urine ratio   Urinalysis, Routine w reflex microscopic   EKG 12-Lead (Completed)   Other Visit Diagnoses     Encounter for hepatitis C screening test for low risk patient       Relevant Orders   Hepatitis C antibody   Screening for HIV without presence of risk factors       Relevant Orders   HIV Antibody (routine testing w rflx)   Screening for malignant neoplasm of prostate        Relevant Orders   PSA      HTN-new dx.  Not controlled.  Has been elevated for >9yr so time to tx.  Will start w/amlodipine 5mg -SED.  Work on .  Stop smoking.  Check cbc,cmp,tsh,lipids,A1C, urine microalb/cr, ua.  Check bp's at home(has wrist cuff).  Keep log and bring cuff to visit in 1 mo.  Dash diet handout Screen psa-pt chooses to do labs. Will discuss other RHM when f/u next month.   Outpatient Encounter Medications as of 07/05/2021  Medication Sig   amLODipine (NORVASC) 5 MG tablet Take 1 tablet (5 mg total) by mouth daily.   [DISCONTINUED] ibuprofen (ADVIL,MOTRIN) 800 MG tablet Take 1 tablet (800 mg total) by mouth 3 (three) times daily.   [DISCONTINUED] oxyCODONE-acetaminophen (PERCOCET/ROXICET) 5-325 MG per tablet Take 1 tablet by mouth every 4 (four) hours as needed for moderate pain.   [DISCONTINUED] Promethazine HCl (PHENERGAN PO) Take 1 tablet by mouth daily as needed (nausea).   No facility-administered encounter medications on file as of 07/05/2021.    Follow-up: Return in about 4 weeks (around 08/02/2021) for htn.   10/03/2021, MD

## 2021-07-06 LAB — HEPATITIS C ANTIBODY
Hepatitis C Ab: NONREACTIVE
SIGNAL TO CUT-OFF: 0.12 (ref <1.00)

## 2021-07-06 LAB — HIV ANTIBODY (ROUTINE TESTING W REFLEX): HIV 1&2 Ab, 4th Generation: NONREACTIVE

## 2021-08-04 ENCOUNTER — Ambulatory Visit: Payer: 59 | Admitting: Family Medicine

## 2021-08-04 ENCOUNTER — Other Ambulatory Visit: Payer: Self-pay | Admitting: Family Medicine

## 2021-10-25 ENCOUNTER — Encounter: Payer: Self-pay | Admitting: *Deleted

## 2022-01-13 ENCOUNTER — Encounter: Payer: Self-pay | Admitting: *Deleted

## 2023-05-26 ENCOUNTER — Encounter (HOSPITAL_BASED_OUTPATIENT_CLINIC_OR_DEPARTMENT_OTHER): Payer: Self-pay | Admitting: Emergency Medicine

## 2023-05-26 ENCOUNTER — Other Ambulatory Visit: Payer: Self-pay

## 2023-05-26 ENCOUNTER — Emergency Department (HOSPITAL_BASED_OUTPATIENT_CLINIC_OR_DEPARTMENT_OTHER)
Admission: EM | Admit: 2023-05-26 | Discharge: 2023-05-26 | Disposition: A | Discharge status: Home / Self Care | Payer: Self-pay | Attending: Emergency Medicine | Admitting: Emergency Medicine

## 2023-05-26 DIAGNOSIS — R519 Headache, unspecified: Secondary | ICD-10-CM

## 2023-05-26 DIAGNOSIS — K047 Periapical abscess without sinus: Secondary | ICD-10-CM | POA: Insufficient documentation

## 2023-05-26 HISTORY — DX: Essential (primary) hypertension: I10

## 2023-05-26 MED ORDER — KETOROLAC TROMETHAMINE 30 MG/ML IJ SOLN
30.0000 mg | Freq: Once | INTRAMUSCULAR | Status: AC
  Administered 2023-05-26: 30 mg via INTRAMUSCULAR
  Filled 2023-05-26: qty 1

## 2023-05-26 MED ORDER — AMOXICILLIN-POT CLAVULANATE 875-125 MG PO TABS
1.0000 | ORAL_TABLET | Freq: Once | ORAL | Status: AC
  Administered 2023-05-26: 1 via ORAL
  Filled 2023-05-26: qty 1

## 2023-05-26 MED ORDER — AMOXICILLIN-POT CLAVULANATE 875-125 MG PO TABS: 1.0000 | ORAL_TABLET | Freq: Two times a day (BID) | ORAL | 0 refills | Status: AC

## 2023-05-26 MED ORDER — METOCLOPRAMIDE HCL 10 MG PO TABS: 10.0000 mg | ORAL_TABLET | Freq: Three times a day (TID) | ORAL | 0 refills | Status: AC | PRN

## 2023-05-26 MED ORDER — IBUPROFEN 600 MG PO TABS: 600.0000 mg | ORAL_TABLET | Freq: Four times a day (QID) | ORAL | 0 refills | Status: AC | PRN

## 2023-05-26 MED ORDER — METOCLOPRAMIDE HCL 10 MG PO TABS
10.0000 mg | ORAL_TABLET | Freq: Once | ORAL | Status: AC
  Administered 2023-05-26: 10 mg via ORAL
  Filled 2023-05-26: qty 1

## 2023-05-26 MED ORDER — DEXAMETHASONE SODIUM PHOSPHATE 10 MG/ML IJ SOLN
6.0000 mg | Freq: Once | INTRAMUSCULAR | Status: AC
  Administered 2023-05-26: 6 mg via INTRAMUSCULAR
  Filled 2023-05-26: qty 1

## 2023-05-26 NOTE — ED Notes (Signed)
 Pt given discharge instructions and reviewed prescriptions. Opportunities given for questions. Pt verbalizes understanding. Jillyn Hidden, RN

## 2023-05-26 NOTE — ED Provider Notes (Signed)
 [place] EMERGENCY DEPARTMENT AT [facility] Provider Note   CSN: [id number] Arrival date & time: 05/26/23  4098     History  Chief Complaint  Patient presents with   Headache   Dental Pain    MALEKO GREULICH is a 53 y.o. male presenting to ED with complaint of right-sided tooth pain and headache.  Patient reports he has had dental issues for several days, but began having a right-sided headache with subjective fevers and chills at home for the past 2 days.  He says he has throbbing mostly in his right temple.  It also hurts to chew due to mouth pain.  He denies history of diabetes.  He took Tylenol  only this morning  HPI     Home Medications Prior to Admission medications   Medication Sig Start Date End Date Taking? Authorizing Provider  amoxicillin -clavulanate (AUGMENTIN ) 875-125 MG tablet Take 1 tablet by mouth every 12 (twelve) hours for 7 days. 05/26/23 06/02/23 Yes Nahshon Reich, Janalyn Me, MD  ibuprofen  (ADVIL ) 600 MG tablet Take 1 tablet (600 mg total) by mouth every 6 (six) hours as needed for up to 30 doses. 05/26/23  Yes Eunice Oldaker, Janalyn Me, MD  metoCLOPramide  (REGLAN ) 10 MG tablet Take 1 tablet (10 mg total) by mouth every 8 (eight) hours as needed for up to 6 doses for nausea. 05/26/23  Yes Maysa Lynn, Janalyn Me, MD  amLODipine  (NORVASC ) 5 MG tablet Take 1 tablet (5 mg total) by mouth daily. 08/04/21   Christel Cousins, MD      Allergies    Patient has no known allergies.    Review of Systems   Review of Systems  Physical Exam Updated Vital Signs BP (!) 156/92   Pulse 62   Temp 98 F (36.7 C) (Oral)   Resp 18   Ht 5\' 10"  (1.778 m)   Wt 108.9 kg   SpO2 98%   BMI 34.44 kg/m  Physical Exam Constitutional:      General: He is not in acute distress. HENT:     Head: Normocephalic and atraumatic.     Comments: Mental fracture of the right upper posterior and lower posterior molar, no palpable abscess; Mild submandibular tenderness, no visible swelling, no parotid  gland swelling Eyes:     Conjunctiva/sclera: Conjunctivae normal.     Pupils: Pupils are equal, round, and reactive to light.  Cardiovascular:     Rate and Rhythm: Normal rate and regular rhythm.  Pulmonary:     Effort: Pulmonary effort is normal. No respiratory distress.  Skin:    General: Skin is warm and dry.  Neurological:     General: No focal deficit present.     Mental Status: He is alert. Mental status is at baseline.  Psychiatric:        Mood and Affect: Mood normal.        Behavior: Behavior normal.     ED Results / Procedures / Treatments   Labs (all labs ordered are listed, but only abnormal results are displayed) Labs Reviewed - No data to display  EKG None  Radiology No results found.  Procedures Procedures    Medications Ordered in ED Medications  ketorolac  (TORADOL ) 30 MG/ML injection 30 mg (30 mg Intramuscular Given 05/26/23 0949)  dexamethasone  (DECADRON ) injection 6 mg (6 mg Intramuscular Given 05/26/23 0948)  amoxicillin -clavulanate (AUGMENTIN ) 875-125 MG per tablet 1 tablet (1 tablet Oral Given 05/26/23 0948)  metoCLOPramide  (REGLAN ) tablet 10 mg (10 mg Oral Given 05/26/23 0948)  ED Course/ Medical Decision Making/ A&P Clinical Course as of 05/26/23 1224  Fri May 26, 2023  1133 Pain is significantly improved with the headache cocktail.  He is stable for discharge [MT]    Clinical Course User Index [MT] Suhayla Chisom, Janalyn Me, MD                                 Medical Decision Making Risk Prescription drug management.   Patient is here with dental pain and headache.  Clinically I suspect this is most likely related to a dental infection and/or sinus infection.  He has no other ongoing viral or flulike symptoms including coughing, diarrhea, muscle aches or abdominal pain.  I have a low suspicion for acute bacterial meningitis, GCA, SAH, Cavernous thrombosis, or other life-threatening causes of headache.  I do not believe he is needing an emergent  CT scan of the head at this time.  We will give him headache medication and start him on Augmentin .  Return precautions were discussed.  The patient's wife is also present at the bedside for this conversation and in agreement.        Final Clinical Impression(s) / ED Diagnoses Final diagnoses:  Nonintractable headache, unspecified chronicity pattern, unspecified headache type  Dental infection    Rx / DC Orders ED Discharge Orders          Ordered    amoxicillin -clavulanate (AUGMENTIN ) 875-125 MG tablet  Every 12 hours        05/26/23 1134    metoCLOPramide  (REGLAN ) 10 MG tablet  Every 8 hours PRN        05/26/23 1134    ibuprofen  (ADVIL ) 600 MG tablet  Every 6 hours PRN        05/26/23 1134              Keonia Pasko, Janalyn Me, MD 05/26/23 1224

## 2023-05-26 NOTE — ED Triage Notes (Addendum)
 Pt caox4, ambulatory, NAD c/o pain in lower teeth/jaw that started 2 days ago and now has pain in R side of throat and R side of head also reporting fever/chills but has not taken temp at home.  Last took Tylenol  this morning at approx 0700.

## 2023-05-26 NOTE — Discharge Instructions (Signed)
 You are being treated for a possible dental infection with antibiotics.  Your symptoms should improve over the next few days of these medications.  If your symptoms are getting worse, specifically worsening headache, changes or loss of vision, numbness or weakness of your face, arms or legs, or any other emergency concerns, please return immediately to the hospital.  *  You are having a headache. This condition is very commonly seen in the Emergency Department. No specific cause was found today for your headache. It may have been a migraine or another cause of headache. Stress, anxiety, fatigue, and depression are common triggers for headaches.   Fortunately, your headache today does not appear to be life-threatening or require hospitalization at this time. This may change in the future. Sometimes headaches can appear benign (not harmful), but then more serious symptoms can develop, which should prompt an immediate re-evaluation by your doctor or the emergency department.  It is very important that you speak to your primary care doctor in the next 48 hours, to re-evaluate your symptoms, and to determine whether you need any further diagnostic testing or treatment.   SEEK MEDICAL ATTENTION IF: - you develop reaction or side effects to the medications prescribed.  - your headache is not improving within 48 hours - you have multiple episodes of vomiting or can't drink fluids - you have a change in pattern from your usual headache (eg. New location, new intensity, new symptoms, etc)  RETURN IMMEDIATELY IF you develop a sudden, severe worsening of your headache or confusion, become poorly responsive, feel like passing out, develop a fever above 100.57F, have a change in speech, vision, or swallowing, develop new weakness, numbness, tingling, or incoordination, or have a seizure.
